# Patient Record
Sex: Male | Born: 1960 | Race: White | Hispanic: No | Marital: Married | Smoking: Former smoker
Health system: Southern US, Community
[De-identification: ages and names within clinical notes are randomized; demographics above are authoritative.]

## PROBLEM LIST (undated history)

## (undated) DIAGNOSIS — I10 Essential (primary) hypertension: Secondary | ICD-10-CM

## (undated) DIAGNOSIS — I639 Cerebral infarction, unspecified: Secondary | ICD-10-CM

## (undated) HISTORY — DX: Essential (primary) hypertension: I10

## (undated) HISTORY — DX: Cerebral infarction, unspecified: I63.9

## (undated) HISTORY — PX: OTHER SURGICAL HISTORY: SHX169

---

## 2007-04-17 ENCOUNTER — Ambulatory Visit (HOSPITAL_BASED_OUTPATIENT_CLINIC_OR_DEPARTMENT_OTHER): Admission: RE | Admit: 2007-04-17 | Discharge: 2007-04-17 | Payer: Self-pay | Admitting: Urology

## 2007-04-22 ENCOUNTER — Ambulatory Visit (HOSPITAL_COMMUNITY): Admission: RE | Admit: 2007-04-22 | Discharge: 2007-04-22 | Payer: Self-pay | Admitting: Urology

## 2007-05-26 ENCOUNTER — Ambulatory Visit (HOSPITAL_COMMUNITY): Admission: RE | Admit: 2007-05-26 | Discharge: 2007-05-26 | Payer: Self-pay | Admitting: Urology

## 2007-06-25 ENCOUNTER — Ambulatory Visit (HOSPITAL_COMMUNITY): Admission: RE | Admit: 2007-06-25 | Discharge: 2007-06-25 | Payer: Self-pay | Admitting: Urology

## 2008-03-12 ENCOUNTER — Ambulatory Visit (HOSPITAL_COMMUNITY): Admission: RE | Admit: 2008-03-12 | Discharge: 2008-03-12 | Payer: Self-pay | Admitting: Urology

## 2008-04-16 ENCOUNTER — Encounter (INDEPENDENT_AMBULATORY_CARE_PROVIDER_SITE_OTHER): Payer: Self-pay | Admitting: General Surgery

## 2008-04-16 ENCOUNTER — Ambulatory Visit (HOSPITAL_COMMUNITY): Admission: RE | Admit: 2008-04-16 | Discharge: 2008-04-16 | Payer: Self-pay | Admitting: General Surgery

## 2008-07-09 ENCOUNTER — Emergency Department (HOSPITAL_COMMUNITY): Admission: EM | Admit: 2008-07-09 | Discharge: 2008-07-09 | Payer: Self-pay | Admitting: *Deleted

## 2008-07-16 ENCOUNTER — Emergency Department (HOSPITAL_COMMUNITY): Admission: EM | Admit: 2008-07-16 | Discharge: 2008-07-16 | Payer: Self-pay | Admitting: Emergency Medicine

## 2010-10-17 NOTE — Op Note (Signed)
NAME:  Connor Phillips, Connor Phillips NO.:  [id number]   MEDICAL RECORD NO.:  [id number]          PATIENT TYPE:  AMB   LOCATION:  [facility]                         FACILITY:  [facility]   PHYSICIAN:  Lindaann Slough, M.D.  DATE OF BIRTH:  10-30-1960   DATE OF PROCEDURE:  04/17/2007  DATE OF DISCHARGE:                               OPERATIVE REPORT   PREOPERATIVE DIAGNOSES:  1. Right ureteral stone.  2. Left ureteropelvic junction stone with bilateral hydronephrosis.   POSTOPERATIVE DIAGNOSES:  1. Right ureteral stone.  2. Left ureteropelvic junction stone with bilateral hydronephrosis.   PROCEDURE:  Cystoscopy, bilateral retrograde pyelogram, ureteroscopy,  holmium laser right ureteral stone and insertion of bilateral double-J  catheters.   SURGEON:  Dr. Brunilda Payor.   ANESTHESIA:  General.   INDICATIONS:  The patient is 50 year old male who was seen in the office  yesterday afternoon with a history of sudden onset of right flank and  right lower quadrant pain 5 days ago.  He was seen at the urgent care by  Dr. Earlene Plater, and a KUB showed a large stone in the left renal pelvis.  The  patient was given an injection of Rocephin and treated with Cipro.  The  pain was associated with nausea but no vomiting.  He had been taking  ibuprofen at home, and he has not had any more pain since Saturday.   When I saw him in the office, I obtained a CT scan that showed severe  left hydronephrosis with a 16-mm stone at the left UPJ and a 6-mm stone  at the right mid ureter with moderate hydronephrosis.  The patient is  scheduled today for ureteroscopy, holmium laser of the right ureteral  stone and insertion of left double-J catheter.   Under general anesthesia, the patient was prepped and draped and placed  in the dorsal lithotomy position.  A #22 panendoscope was inserted in  the bladder.  The anterior urethra is normal.  There is moderate  prostatic hypertrophy.  The bladder mucosa is normal.  There is  no stone  or tumor in the bladder.  The ureteral orifices are in normal position  and shape.   Right Retrograde pyelogram.   A sensor tip guide wire was passed through an open-ended catheter, and  the guide wire and ureteral catheter were passed through the cystoscope  into the right ureteral orifice.  The sensor tip guidewire was then  passed into the ureter and advanced all of the way up into the renal  pelvis.  The open-ended catheter was then advanced over the guidewire  into the distal ureter.  The guidewire was then removed.  Contrast was  then injected through the open-ended catheter.  The distal ureter  appears normal.  There is a filling defect in the mid ureter, and there  is moderate dilation of the ureter and the collecting system.  The  sensor tip guidewire was then passed through the ureteral catheter, and  the ureteral catheter was removed.   The inner sheath of a ureteroscope access sheath was passed over the  guidewire, and the intramural  ureter was then dilated.  The ureteroscope  access sheath was removed.  A #6.5 French rigid ureteroscope was then  passed in the bladder and in the ureter.  The ureteroscope was advanced  up to the mid ureter, and the stone was visualized.  The stone was  impacted in the mid ureter.  A holmium laser fiber was then passed  through the ureteroscope, and the stone was fragmented in multiple stone  fragments.  The stone fragments were then extracted with the nitinol  stone basket.  The guidewire was then back loaded into the cystoscope,  and a #6-French 26 double-J catheter was passed over the guidewire.  The  proximal curl of the double-J catheter is in the renal pelvis; the  distal curl is in the bladder.  The guidewire was then removed.   Left Retrograde Pyelogram:   The sensor tip guidewire was then passed over an open-ended catheter,  and the guide wire was passed through the left ureteral orifice and  advanced up to the UPJ  stone, and the guidewire could not pass the  stone.  The open-ended catheter was then advanced all the way up to the  upper ureter, and the sensor tip guidewire was removed.  A glide wire  was passed through the open-ended catheter, and after several attempts,  I was able to pass the Glidewire beyond the stone into the renal pelvis.  Then, the open-ended catheter was passed beyond the stone with some  difficulty.  The guidewire was then removed.  Contrast was then injected through the open-ended catheter.  The renal  pelvis and collecting system are markedly dilated.  The sensor tip  guidewire was then passed through the open-ended catheter, and the open-  ended catheter was removed.   The guidewire was then back loaded into the cystoscope, and a 6-French  26 double-J catheter was passed over the guidewire with some difficulty  into the renal pelvis.  The guidewire was then removed.  The proximal  curl of the double-J catheter is in the renal pelvis.  The distal curl  is in the bladder.  The bladder was then emptied, and the cystoscope was  removed.   The patient tolerated the procedure well and left the OR in satisfactory  condition to post-anesthesia care unit.      Lindaann Slough, M.D.  Electronically Signed     MN/MEDQ  D:  04/17/2007  T:  04/18/2007  Job:  [id number]   cc:   Louanna Raw  Fax: [phone]

## 2010-10-17 NOTE — Op Note (Signed)
NAME:  Connor Phillips, Connor Phillips NO.:  [id number]   MEDICAL RECORD NO.:  [id number]          PATIENT TYPE:  AMB   LOCATION:  DAY                          FACILITY:  [facility]   PHYSICIAN:  Juanetta Gosling, MDDATE OF BIRTH:  14-Feb-1961   DATE OF PROCEDURE:  04/16/2008  DATE OF DISCHARGE:                               OPERATIVE REPORT   PREOPERATIVE DIAGNOSIS:  Postoperative neck mass.   POSTOPERATIVE DIAGNOSIS:  Sebaceous cyst, posterior neck.   PROCEDURE:  Posterior neck mass excisional biopsy.   SURGEON:  Juanetta Gosling, M.D.   ASSISTANT:  None.   ANESTHESIA:  General.   FINDINGS:  Sebaceous cyst.   SPECIMENS:  Cyst and contents to pathology.   ESTIMATED BLOOD LOSS:  Minimal.   COMPLICATIONS:  None.   DRAINS:  None.   DISPOSITION:  To PACU in stable condition.   INDICATIONS:  Mr. Fudala is a 50 year old male with a long-term history of  posterior neck mass slowly increasing in size over that time. When I  examined him, he had about 2 x 3-cm mobile mass that was nontender. I  counseled for excisional biopsy.   PROCEDURE:  After informed consent was obtained, the patient was taken  to the operating room. He was placed under general endotracheal  anesthesia without complication and rolled into the prone position and  appropriately padded. The area had previously been identified. An  approximately 3-cm incision was made overlying the mass which was then  extended down to the level of the mass. The mass was entered at one  position, and this was noted to be a very large sebaceous cyst with a  large amount of material involved with it. All the material was removed,  and the entire cyst in all of the cyst wall was also excised using  electrocautery. This was then copiously irrigated. I  closed the dermis with a 4-0 Vicryl and then closed the skin with a 2-0  nylon with external sutures that will be removed in 2 weeks. Sterile  dressing was placed on this. He  tolerated this well and was transferred  to the recovery room in stable condition.      Juanetta Gosling, MD  Electronically Signed     MCW/MEDQ  D:  04/16/2008  T:  04/16/2008  Job:  [id number]

## 2010-10-17 NOTE — Op Note (Signed)
NAME:  Connor Phillips, Connor Phillips NO.:  [id number]   MEDICAL RECORD NO.:  [id number]          PATIENT TYPE:  AMB   LOCATION:  DAY                          FACILITY:  [facility]   PHYSICIAN:  Valetta Fuller, M.D.  DATE OF BIRTH:  [birth year]   DATE OF PROCEDURE:  DATE OF DISCHARGE:                               OPERATIVE REPORT   PREOPERATIVE DIAGNOSIS:  1. Large impacted left proximal ureteral calculus.  2. Poorly functioning left kidney.   POSTOPERATIVE DIAGNOSIS:  1. Large impacted left proximal ureteral calculus.  2. Poorly functioning left kidney.   PROCEDURE PERFORMED:  Cystoscopy with double-J stent removal, left  retrograde pyelography, left rigid ureteroscopy, left flexible  ureteroscopy, holmium laser lithotripsy with basketing of fragments and  left double-J stent placement.   SURGEON:  Dr. Isabel Caprice.   ANESTHESIA:  General.   INDICATIONS:  Connor Phillips is a 50 year old male.  He had presented with  right flank pain and hematuria and was diagnosed with a right distal  ureteral calculus.  He was under the care of Dr. Su Grand at that time.  The patient was also noted to have severe left sided hydronephrosis for  what appeared to be a longstanding chronically obstructed left kidney  from a 12 x 9 mm stone at the left ureteropelvic junction.  The  patient's subsequently had successful ureteroscopy and treatment of the  right distal stone.  A left double-J stent was placed.  The patient has  been noted to have a substantially poorly functioning kidney on the left  side.  His Lasix renal scan showed this kidney only to be functioning at  approximately 15-20%.  The patient however did elect to try to have this  stone removed and the kidney unobstructed to try to preserve whatever  renal function was available.  The patient did undergo ureteroscopic  surgery approximately a month ago.  At that time we debulked as much of  the impacted stone as we could but then due to some  bleeding  visualization got poor and we had to abandon the procedure at that time.  The patient presents now for another attempt at dealing with this highly  impacted large stone.   TECHNIQUE AND FINDINGS:  The patient was brought to the operating room  where he had successful induction of general anesthesia.  He was placed  in lithotomy position, prepped and draped in the usual manner.  Cystoscopy initially was unremarkable.  The left double-J stent was seen  in good position.  It was grasped and partly removed out of the penis.  A guidewire was placed through the stent up to the left renal pelvis.  The cystoscope was removed at that point.  A long rigid 6.5 French  ureteroscope was then easily engaged in the ureter and driven up to the  stone.  We were able to get beyond the stone and into the renal pelvis.  The stone itself was markedly adherent to the wall of the ureter and  certainly significantly impacted within the mucosa.  There was quite a  bit of resultant ureteral edema.  With the rigid ureteroscope, we were  able to start to perform laser lithotripsy on the stone.  Unfortunately  due to the angles and the impacted nature of the stone, we were only  able to debulk a portion of it with the rigid scope. For that reason, we  removed the rigid ureteroscope and placed an access sheath.  A flexible  ureteroscope was then placed.  We continued to work on the stone with a  365 degree holmium laser fiber.  Eventually we were able to get the  stone out of the mucosa and wall of the bladder and at that point it was  freely floating in the proximal ureter right at the ureteropelvic  junction.  The stone, however then migrated into a lower pole calix.   With the flexible ureteroscope, we were again able to locate the stone.  We spent then an additional hour to an hour and 15 minutes performing  laser lithotripsy on the stone.  It was clearly markedly dense and very  hard.  We ended up  having to drill a series of holes in the stone and  then slowly chip away at this.  As we did fragments were basket  extracted.  The largest piece was approximately 10 x 8 mm which was  removed with the basket. At the end of the procedure,  there appeared to  be several small fragments, but we felt that the majority of the stone  had been markedly debulked and most importantly we had been able to get  the stone out of the proximal ureter that had been heavily impacted.   We ended up performing the retrograde pyelogram and confirmed that the  ureter was intact as was the collecting system.  This was done with  fluoroscopic guidance and interpretation.  Once the ureteroscope and  sheath were removed, we ended up placing an 8-French 24 cm stent over  the guidewire.  This was confirmed to be in good position.  The patient  appeared to tolerate the procedure well and no obvious complications  occurred.  He was brought to the recovery room in stable condition.  Lidocaine jelly was instilled per urethra and he was given at the end of  suppository.  The large stone fragment was given to the patient's wife  so he could examine this and we will send for stone analysis down the  road.           ______________________________  Valetta Fuller, M.D.  Electronically Signed     DSG/MEDQ  D:  06/25/2007  T:  06/25/2007  Job:  [id number]

## 2010-10-17 NOTE — Op Note (Signed)
NAME:  Connor Phillips, Connor Phillips NO.:  [id number]   MEDICAL RECORD NO.:  [id number]          PATIENT TYPE:  AMB   LOCATION:  DAY                          FACILITY:  [facility]   PHYSICIAN:  Valetta Fuller, M.D.  DATE OF BIRTH:  Jul 13, 1960   DATE OF PROCEDURE:  05/26/2007  DATE OF DISCHARGE:  05/26/2007                               OPERATIVE REPORT   PREOPERATIVE DIAGNOSIS:  Large impacted left proximal ureteral calculus.   POSTOPERATIVE DIAGNOSIS:  Large impacted left proximal ureteral  calculus.   PROCEDURE PERFORMED:  Cystoscopy, left retrograde pyelography, left  rigid and left flexible ureteroscopy with Holmium laser lithotripsy of  calculus, double-J stent placement.   SURGEON:  Valetta Fuller, M.D.   ANESTHESIA:  General.   INDICATIONS:  Connor Phillips is a 50 year old male.  He had developed the  sudden onset of right flank pain and a CT scan showed a right ureteral  stone.  Incidentally, he was noted to have an asymptomatic stone at his  left ureteropelvic junction/proximal ureter.  On that side, he had what  appeared to be very severe hydronephrosis with very little in the way of  what appeared to be functioning parenchyma.  The patient subsequently  did have a renal scan which showed only about 15% of the overall  function coming from the left kidney.  His creatinine, which had been  markedly elevated, did return down to 1.6.  He subsequently had a  treatment by Dr. Brunilda Payor which included successful right ureteroscopy with  stent placement and a double-J stent placed on the left side.  The  patient had an extensive discussion with Dr. Brunilda Payor and then myself about  treatment options.  We felt that the left kidney had very little  function and probably not a lot of salvageable function.  We did not  feel the kidney needed to be removed unless it was more clinically  detrimental to him.  We did feel, however, that it might be reasonable  to make an attempt to unobstruct  that kidney to try to lessen the  likelihood of him having problems down the road.  He wanted an attempt  to try to salvage that kidney and whatever remaining function that  possibly could be salvaged.  Again, I do not think he has enough  functioning of the left kidney to stay off dialysis if he lost the right  kidney, but we thought it would be worth an attempt with ureteroscopy to  try to render him stone free if at all possible.  He understood that  could be potentially very difficult due to the location, size of the  stone, as well as  its very long standing probably impacted nature.   TECHNIQUE AND FINDINGS:  The patient was brought to the operating room  where he had successful induction of general anesthesia.  He was placed  in lithotomy position, prepped and draped in the usual manner.  Cystoscopy revealed a relatively unremarkable prostate.  Bilateral  double-J stents were in good position.  The right double-J stent was  removed as per Dr.  Nesi's instructions since the right ureteral stone  had been dealt with successfully.  The left stent was partially removed  and a guidewire was placed through the stent.  On fluoroscopy, one could  see a dense stone in the proximal ureter maybe 1 cm beneath the  ureteropelvic junction.  Once the stent was removed, we did perform  retrograde pyelogram and one could see a very high grade obstruction  secondary to that stone.  There appeared to be marked hydronephrosis  with severe dilation of the renal pelvis and caliceal system.   Rigid ureteroscopy was then performed.  The distal ureter was easily  engaged.  The long ureteroscope was utilized and we were able to get up  to within about 1 cm of the stone, but because of considerable mucosa  edema and an acute angulation, we were really unable to visualize the  stone with the rigid ureteroscope.  For that reason, the rigid  ureteroscope was removed and an access sheath was placed.  We then   utilized a flexible ureteroscope and were able to get up to the stone.  The stone, itself, appeared to be markedly impacted within the ureter.  There was evidence that the mucosa was partially growing over portions  of the stone and, again, this appeared consistent with a long standing  impacted calculus in this location.  We used the Holmium laser fiber to  start to fracture the stone.  The stone was markedly difficult to  fracture and little pieces came off one at a time.  We spent  approximately 1 hour 15 minutes debulking as much of the stone as we  could.  At that point, because of ongoing oozing from irritation of the  mucosa, visualization became more difficult and we did not feel it was  safe to continue to proceed.  The patient clearly had some residual  stone and it was difficult to really determine precisely how much of the  stone we had actually fractured, but probably in the order of 1/3 to 1/2  of the stone.  We decided to stop the procedure at that time for safety  reasons and felt that if he wanted another attempt that we would bring  him back at another time.  Over the guidewire, we went ahead and placed  an 8-French 24 cm stent without much difficulty.  The patient appeared  to tolerate things well and there were no obvious complications or  difficulties.  He was brought to the recovery room in stable condition.           ______________________________  Valetta Fuller, M.D.  Electronically Signed     DSG/MEDQ  D:  05/28/2007  T:  05/28/2007  Job:  [id number]

## 2011-02-22 LAB — HEMOGLOBIN AND HEMATOCRIT, BLOOD: Hemoglobin: 15.2

## 2011-03-06 LAB — CBC
Hemoglobin: 16.4
MCHC: 33.9
Platelets: 250
RBC: 5.14

## 2011-03-06 LAB — DIFFERENTIAL
Basophils Relative: 2 — ABNORMAL HIGH
Eosinophils Relative: 3
Monocytes Relative: 10
Neutro Abs: 2.6
Neutrophils Relative %: 50

## 2011-03-13 LAB — POCT HEMOGLOBIN-HEMACUE: Hemoglobin: 11.7 — ABNORMAL LOW

## 2011-03-13 LAB — URINE CULTURE: Colony Count: NO GROWTH

## 2012-01-17 ENCOUNTER — Encounter: Payer: Self-pay | Admitting: Sports Medicine

## 2012-01-17 ENCOUNTER — Ambulatory Visit (INDEPENDENT_AMBULATORY_CARE_PROVIDER_SITE_OTHER): Payer: BC Managed Care – PPO | Admitting: Sports Medicine

## 2012-01-17 VITALS — BP 141/105 | HR 61 | Ht 67.0 in | Wt 165.0 lb

## 2012-01-17 DIAGNOSIS — M2170 Unequal limb length (acquired), unspecified site: Secondary | ICD-10-CM | POA: Insufficient documentation

## 2012-01-17 DIAGNOSIS — M25569 Pain in unspecified knee: Secondary | ICD-10-CM

## 2012-01-17 DIAGNOSIS — M25561 Pain in right knee: Secondary | ICD-10-CM | POA: Insufficient documentation

## 2012-01-17 NOTE — Assessment & Plan Note (Signed)
Given a felt correction to use in his running shoes

## 2012-01-17 NOTE — Assessment & Plan Note (Signed)
I think he is an excellent candidate for rehabilitation exercises  Work on his strength and then when he returns check his running gait as well

## 2012-01-17 NOTE — Progress Notes (Signed)
Subjective Connor Phillips is a pleasant 51 y.o male who presents to [facility] for persistent right knee pain.  Patient states he has more anterior knee pain which is worse with running and stairs.  He recently began getting back into running and has been having some more anterior knee pain.  He has tried to warm up on a stationary bike which does help.  He also does better on soft surfaces vs. Hard.  No weakness or numbness.  No noted injury.  No swelling.  Does state about 10 years ago when playing tennis he may have "tweaked" his knee - at that time had negative plain films and told he had mild inflammation and resolved on its own with rest.  Active Ambulatory Problems    Diagnosis Date Noted  . No Active Ambulatory Problems   Resolved Ambulatory Problems    Diagnosis Date Noted  . No Resolved Ambulatory Problems   No Additional Past Medical History   History reviewed. No pertinent family history.  History reviewed. No pertinent past surgical history.  History   Social History  . Marital Status: Married    Spouse Name: N/A    Number of Children: N/A  . Years of Education: N/A   Social History Main Topics  . Smoking status: Former Smoker    Quit date: 10/03/2010  . Smokeless tobacco: Never Used  . Alcohol Use: 1.2 oz/week    2 Cans of beer per week  . Drug Use: None  . Sexually Active: None   Other Topics Concern  . None   Social History Narrative  . None   ROS: as stated in HPI  Objective  Filed Vitals:   01/17/12 1004  BP: 141/105  Pulse: 61    GEN: NAD, well appearing Normal affect Right knee: mild crepitus vs left.  No swelling appreciated.  No TTP to bony prominences.  No joint line tenderness.  Negative provocative testing of ACL, PCL, LCL, MCL and meniscus.  Has atrophy of VMO on right compared to left. Foot exam - normal arch Hip exam - pain and weakness with abductor testing on right compared to left Does have limb length discrepancy.  Right measures 90cm, Left  measures 91.3cm. Neurovascularly intact distally.  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 In clinic today address all questions for patient 2. Gave specific home instructions to start for strengthening particularly VMO and hip abductors 3. Given limb length longer on L > R - gave heel lift in right shoe for added support to assist in biomechanics 4. Instructed to continue to run as tolerated 5. Advised to warm up prior to running - stationary bike vs. Walking 6. Stretch afterwards  Patient understands and agrees with plan

## 2012-02-28 ENCOUNTER — Ambulatory Visit (INDEPENDENT_AMBULATORY_CARE_PROVIDER_SITE_OTHER): Payer: BC Managed Care – PPO | Admitting: Sports Medicine

## 2012-02-28 VITALS — BP 138/80 | Ht 67.0 in | Wt 165.0 lb

## 2012-02-28 DIAGNOSIS — M25569 Pain in unspecified knee: Secondary | ICD-10-CM

## 2012-02-28 DIAGNOSIS — M25561 Pain in right knee: Secondary | ICD-10-CM

## 2012-02-28 NOTE — Progress Notes (Signed)
  Subjective:    Patient ID: Connor Phillips, male    DOB: [birth year], 51 y.o.   MRN: [id number]  HPI  1. Knee pain:  Here for f/u of R knee pain.  States that the pain he was experiencing has resolved, however he has new pain.  Pain now located along medial aspect of knee.  Pain is worse after running.  Does not bother him using elliptical.  Has not noticed any swelling.  He was doing around 1.5 miles per day and increased to 3-3.5 miles recently.  He is also having pain from shin splints on the R leg.   Review of Systems Denies fever, chills, joint swelling or pain.    Objective:   Physical Exam R Knee: Normal to inspection with no erythema or effusion or obvious bony abnormalities. Palpation with tenderness at pes anserine.   No warmth or joint line tenderness or patellar tenderness or condyle tenderness. ROM normal in flexion and extension and lower leg rotation. Ligaments with solid consistent endpoints including ACL, PCL, LCL, MCL. Negative Mcmurray's and provocative meniscal tests. Non painful patellar compression. Patellar and quadriceps tendons unremarkable. Hamstring and quadriceps strength is normal.  Mild pain with sartorius testing  TTP along distal medial tibia, non-localizing  Foot:  Loss of long arch of R foot.        Assessment & Plan:

## 2012-02-28 NOTE — Assessment & Plan Note (Signed)
Previous pain resolved, now more likely to be pain at the pes anserine with some pain testing sartorius Fitted for knee compression sleeve Given exercises to strengthen sartorius  Advised to stick at 1.5-2 miles a couple days per week and when comfortable for 6 weeks, can began to gradually increase mileage. Continue current insoles with heel lift to correct leg length discrepancy We will see back in 6-8 weeks, if not improving will plan for custom orthotics

## 2012-04-10 ENCOUNTER — Ambulatory Visit (INDEPENDENT_AMBULATORY_CARE_PROVIDER_SITE_OTHER): Payer: BC Managed Care – PPO | Admitting: Sports Medicine

## 2012-04-10 ENCOUNTER — Encounter: Payer: Self-pay | Admitting: Sports Medicine

## 2012-04-10 VITALS — HR 76 | Ht 67.0 in | Wt 165.0 lb

## 2012-04-10 DIAGNOSIS — S86899A Other injury of other muscle(s) and tendon(s) at lower leg level, unspecified leg, initial encounter: Secondary | ICD-10-CM | POA: Insufficient documentation

## 2012-04-10 DIAGNOSIS — M25569 Pain in unspecified knee: Secondary | ICD-10-CM

## 2012-04-10 DIAGNOSIS — M2170 Unequal limb length (acquired), unspecified site: Secondary | ICD-10-CM

## 2012-04-10 DIAGNOSIS — M25561 Pain in right knee: Secondary | ICD-10-CM

## 2012-04-10 DIAGNOSIS — IMO0002 Reserved for concepts with insufficient information to code with codable children: Secondary | ICD-10-CM

## 2012-04-10 NOTE — Assessment & Plan Note (Signed)
Shin splints from increased stress after prolonged period of not running exacerbated by leg length discrepancy. Believe patient would benefit from custom orthotics-patient scheduling visit for this purpose. Patient will continue to only slowly increase running and stop at any point where pain develops.

## 2012-04-10 NOTE — Assessment & Plan Note (Signed)
Strength has improved in sartorius and quadriceps with strenthening program. Knee pain has resolved both from patellofemoral and pes anserine insertion point. Wearing knee sleeve when running.

## 2012-04-10 NOTE — Assessment & Plan Note (Signed)
Will advance from insoles to custom orthotics.

## 2012-04-10 NOTE — Progress Notes (Signed)
Subjective:   HPI  1. Knee pain follow up-right medial knee pain resolved upon wearing knee sleeve and starting strengthening exercises for sartorius.   Patient started running a few days after the knee pain had resolved. He was using his insoles during this time. On the first day running he developed bilateral shin pain that lasted for 3 days and kept him from running. Patient transitioned to elliptical for 3 weeks. He returned to running at that time and pain had resolved but he completed lighter runs such as 1.5 miles 1-2x per week. He has an ultimate goal of running a 5k and wants help slowly getting to his goal. Patient has not run in over 35 years before this past year.   ROS--See HPI  Past Medical History-does not have a PCP, leg length discrepancy.  Reviewed problem list.  Medications- reviewed and updated Chief complaint-noted  Objective: Pulse 76  Ht 5\' 7"  (1.702 m)  Wt 165 lb (74.844 kg)  BMI 25.84 kg/m2 Gen: NAD, resting comfortably on table MSK R knee:  Normal to inspection with no erythema or effusion or obvious bony abnormalities.  No pain with palpation  at pes anserine. Non painful patellar compression.  No warmth or joint line tenderness or patellar tenderness or condyle tenderness.  ROM normal in flexion and extension and lower leg rotation.  Ligaments with solid consistent endpoints including ACL, PCL, LCL, MCL. Negative Mcmurray.  Hamstring and quadriceps strength is normal. No pain with sartorius testing. Quadricep and sartorius strength is increased since previous exam. VMO length approximately 2cm shorter than desired.  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 Mild pronation noted. Leg length discrepancy with left leg approximately 1 cm longer than right.    Assessment/Plan: See problem oriented charted  Also advised patient to establish with PCP given that he has none.

## 2012-05-08 ENCOUNTER — Encounter: Payer: BC Managed Care – PPO | Admitting: Sports Medicine

## 2012-06-10 ENCOUNTER — Encounter: Payer: BC Managed Care – PPO | Admitting: Sports Medicine

## 2012-12-02 ENCOUNTER — Encounter: Payer: Self-pay | Admitting: Internal Medicine

## 2013-01-23 ENCOUNTER — Ambulatory Visit (AMBULATORY_SURGERY_CENTER): Payer: BC Managed Care – PPO

## 2013-01-23 ENCOUNTER — Encounter: Payer: Self-pay | Admitting: Internal Medicine

## 2013-01-23 VITALS — Ht 67.0 in | Wt 163.0 lb

## 2013-01-23 DIAGNOSIS — Z1211 Encounter for screening for malignant neoplasm of colon: Secondary | ICD-10-CM

## 2013-01-23 MED ORDER — MOVIPREP 100 G PO SOLR: 1.0000 | Freq: Once | ORAL | Status: DC

## 2013-02-06 ENCOUNTER — Ambulatory Visit (AMBULATORY_SURGERY_CENTER): Payer: BC Managed Care – PPO | Admitting: Internal Medicine

## 2013-02-06 ENCOUNTER — Encounter: Payer: Self-pay | Admitting: Internal Medicine

## 2013-02-06 VITALS — BP 123/83 | HR 62 | Temp 98.1°F | Resp 19 | Ht 67.0 in | Wt 163.0 lb

## 2013-02-06 DIAGNOSIS — D126 Benign neoplasm of colon, unspecified: Secondary | ICD-10-CM

## 2013-02-06 DIAGNOSIS — Z1211 Encounter for screening for malignant neoplasm of colon: Secondary | ICD-10-CM

## 2013-02-06 MED ORDER — SODIUM CHLORIDE 0.9 % IV SOLN: 500.0000 mL | INTRAVENOUS | Status: DC

## 2013-02-06 NOTE — Patient Instructions (Signed)

## 2013-02-06 NOTE — Progress Notes (Signed)
No complaints noted in the recovery room. Maw   

## 2013-02-06 NOTE — Progress Notes (Signed)
Report to pacu rn, vss, bbs=clear, alert and talking

## 2013-02-06 NOTE — Op Note (Signed)
[facility] [address] [place], [address]   COLONOSCOPY PROCEDURE REPORT  PATIENT: Ariv, Penrod  MR#: [id number] BIRTHDATE: 1960/12/22 , 52  yrs. old GENDER: Male ENDOSCOPIST: Beverley Fiedler, MD REFERRED BY: Alysia Penna, MD PROCEDURE DATE:  02/06/2013 PROCEDURE: First Screening Colonoscopy - Avg.  risk and is 50 yrs.  old or older Yes.  Prior Negative Screening - Now for repeat screening. N/A  History of Adenoma - Now for follow-up colonoscopy & has been > or = to 3 yrs.  N/A  Polyps Removed Today? Yes. ASA CLASS:   Class I INDICATIONS:average risk screening and first colonoscopy. MEDICATIONS: MAC sedation, administered by CRNA and Propofol (Diprivan) 270 mg IV  DESCRIPTION OF PROCEDURE:   After the risks benefits and alternatives of the procedure were thoroughly explained, informed consent was obtained.  A digital rectal exam revealed no rectal mass.   The LB [id number]  endoscope was introduced through the anus and advanced to the terminal ileum which was intubated for a short distance. No adverse events experienced.   The quality of the prep was good, using MoviPrep  The instrument was then slowly withdrawn as the colon was fully examined.   COLON FINDINGS: The mucosa appeared normal in the terminal ileum. A sessile polyp measuring 5-6 mm in size with a mucous cap was found in the ascending colon.  A polypectomy was performed with a cold snare.  The resection was complete and the polyp tissue was completely retrieved.   Four sessile polyps ranging between 3-65mm in size were found in the descending colon, sigmoid colon, and rectosigmoid colon.  Polypectomy was performed using cold snare. All resections were complete and all polyp tissue was completely retrieved.   There was mild scattered diverticulosis noted in the sigmoid colon.  Retroflexed views revealed no abnormalities. The time to cecum=1 minutes 17 seconds.  Withdrawal time=13 minutes  34 seconds.  The scope was withdrawn and the procedure completed.  COMPLICATIONS: There were no complications.    ENDOSCOPIC IMPRESSION: 1.   Normal mucosa in the terminal ileum 2.   Sessile polyp measuring 5-6 mm in size was found in the ascending colon; polypectomy was performed with a cold snare 3.   Four sessile polyps ranging between 3-64mm in size were found in the descending colon, sigmoid colon, and rectosigmoid colon; Polypectomy was performed using cold snare 4.   There was mild diverticulosis noted in the sigmoid colon  RECOMMENDATIONS: 1.  Await pathology results 2.  High fiber diet 3.  Timing of repeat colonoscopy will be determined by pathology findings. 4.  You will receive a letter within 1-2 weeks with the results of your biopsy as well as final recommendations.  Please call my office if you have not received a letter after 3 weeks.   eSigned:  Beverley Fiedler, MD 02/06/2013 9:00 AM   cc: The Patient, Alysia Penna, MD   PATIENT NAME:  Renly, Roots MR#: [id number]

## 2013-02-06 NOTE — Progress Notes (Signed)
Called to room to assist during endoscopic procedure.  Patient ID and intended procedure confirmed with present staff. Received instructions for my participation in the procedure from the performing physician.  

## 2013-02-09 ENCOUNTER — Telehealth: Payer: Self-pay

## 2013-02-09 NOTE — Telephone Encounter (Signed)
Left message on answering machine. 

## 2013-02-11 ENCOUNTER — Encounter: Payer: Self-pay | Admitting: Internal Medicine

## 2018-01-09 ENCOUNTER — Encounter: Payer: Self-pay | Admitting: Internal Medicine

## 2019-03-30 ENCOUNTER — Other Ambulatory Visit: Payer: Self-pay | Admitting: *Deleted

## 2019-03-30 DIAGNOSIS — Z20822 Contact with and (suspected) exposure to covid-19: Secondary | ICD-10-CM

## 2019-03-31 LAB — NOVEL CORONAVIRUS, NAA: SARS-CoV-2, NAA: NOT DETECTED

## 2020-04-16 ENCOUNTER — Ambulatory Visit: Payer: Self-pay | Attending: Internal Medicine

## 2020-04-16 DIAGNOSIS — Z23 Encounter for immunization: Secondary | ICD-10-CM

## 2020-04-16 NOTE — Progress Notes (Signed)
   Covid-19 Vaccination Clinic  Name:  CRAVEN CREAN    MRN: [id number] DOB: Oct 13, 1960  04/16/2020  Mr. Beza was observed post Covid-19 immunization for 15 minutes without incident. He was provided with Vaccine Information Sheet and instruction to access the V-Safe system.   Mr. Poser was instructed to call 911 with any severe reactions post vaccine: Marland Kitchen Difficulty breathing  . Swelling of face and throat  . A fast heartbeat  . A bad rash all over body  . Dizziness and weakness

## 2020-09-16 ENCOUNTER — Observation Stay (HOSPITAL_COMMUNITY)
Admission: EM | Admit: 2020-09-16 | Discharge: 2020-09-17 | Disposition: A | Discharge status: Home / Self Care | Payer: 59 | Attending: Internal Medicine | Admitting: Internal Medicine

## 2020-09-16 ENCOUNTER — Emergency Department (HOSPITAL_COMMUNITY): Payer: 59

## 2020-09-16 ENCOUNTER — Other Ambulatory Visit: Payer: Self-pay

## 2020-09-16 ENCOUNTER — Encounter (HOSPITAL_COMMUNITY): Payer: Self-pay

## 2020-09-16 ENCOUNTER — Observation Stay (HOSPITAL_COMMUNITY): Payer: 59

## 2020-09-16 DIAGNOSIS — Z87891 Personal history of nicotine dependence: Secondary | ICD-10-CM | POA: Diagnosis not present

## 2020-09-16 DIAGNOSIS — Y900 Blood alcohol level of less than 20 mg/100 ml: Secondary | ICD-10-CM | POA: Insufficient documentation

## 2020-09-16 DIAGNOSIS — Z20822 Contact with and (suspected) exposure to covid-19: Secondary | ICD-10-CM | POA: Diagnosis not present

## 2020-09-16 DIAGNOSIS — R471 Dysarthria and anarthria: Secondary | ICD-10-CM | POA: Insufficient documentation

## 2020-09-16 DIAGNOSIS — R29810 Facial weakness: Secondary | ICD-10-CM | POA: Diagnosis present

## 2020-09-16 DIAGNOSIS — R4781 Slurred speech: Secondary | ICD-10-CM | POA: Insufficient documentation

## 2020-09-16 DIAGNOSIS — I10 Essential (primary) hypertension: Secondary | ICD-10-CM | POA: Insufficient documentation

## 2020-09-16 LAB — URINALYSIS, ROUTINE W REFLEX MICROSCOPIC
Bilirubin Urine: NEGATIVE
Fatty Casts, UA: 1
Glucose, UA: NEGATIVE mg/dL
Ketones, ur: NEGATIVE mg/dL
Nitrite: NEGATIVE
Protein, ur: NEGATIVE mg/dL
Specific Gravity, Urine: 1.019 (ref 1.005–1.030)
WBC, UA: >50 WBC/hpf — ABNORMAL HIGH (ref 0–5)
pH: 6 (ref 5.0–8.0)

## 2020-09-16 LAB — COMPREHENSIVE METABOLIC PANEL
ALT: 23 U/L (ref 0–44)
AST: 26 U/L (ref 15–41)
Albumin: 4.1 g/dL (ref 3.5–5.0)
Alkaline Phosphatase: 76 U/L (ref 38–126)
Anion gap: 10 (ref 5–15)
BUN: 17 mg/dL (ref 6–20)
CO2: 24 mmol/L (ref 22–32)
Calcium: 9 mg/dL (ref 8.9–10.3)
Chloride: 107 mmol/L (ref 98–111)
Creatinine, Ser: 0.97 mg/dL (ref 0.61–1.24)
GFR, Estimated: >60 mL/min (ref >60)
Glucose, Bld: 89 mg/dL (ref 70–99)
Potassium: 3.8 mmol/L (ref 3.5–5.1)
Sodium: 141 mmol/L (ref 135–145)
Total Bilirubin: 0.6 mg/dL (ref 0.3–1.2)
Total Protein: 7.8 g/dL (ref 6.5–8.1)

## 2020-09-16 LAB — RESP PANEL BY RT-PCR (FLU A&B, COVID) ARPGX2
Influenza A by PCR: NEGATIVE
Influenza B by PCR: NEGATIVE
SARS Coronavirus 2 by RT PCR: NEGATIVE

## 2020-09-16 LAB — DIFFERENTIAL
Abs Immature Granulocytes: 0.05 10*3/uL (ref 0.00–0.07)
Basophils Absolute: 0.1 10*3/uL (ref 0.0–0.1)
Basophils Relative: 1 %
Eosinophils Absolute: 0.2 10*3/uL (ref 0.0–0.5)
Eosinophils Relative: 3 %
Immature Granulocytes: 1 %
Lymphocytes Relative: 24 %
Lymphs Abs: 1.8 10*3/uL (ref 0.7–4.0)
Monocytes Absolute: 0.7 10*3/uL (ref 0.1–1.0)
Monocytes Relative: 10 %
Neutro Abs: 4.4 10*3/uL (ref 1.7–7.7)
Neutrophils Relative %: 61 %

## 2020-09-16 LAB — CBC
HCT: 49.5 % (ref 39.0–52.0)
Hemoglobin: 17 g/dL (ref 13.0–17.0)
MCH: 31.8 pg (ref 26.0–34.0)
MCHC: 34.3 g/dL (ref 30.0–36.0)
MCV: 92.7 fL (ref 80.0–100.0)
Platelets: 229 10*3/uL (ref 150–400)
RBC: 5.34 MIL/uL (ref 4.22–5.81)
RDW: 12.5 % (ref 11.5–15.5)
WBC: 7.2 10*3/uL (ref 4.0–10.5)
nRBC: 0 % (ref 0.0–0.2)

## 2020-09-16 LAB — RAPID URINE DRUG SCREEN, HOSP PERFORMED
Amphetamines: NOT DETECTED
Barbiturates: NOT DETECTED
Benzodiazepines: NOT DETECTED
Cocaine: NOT DETECTED
Opiates: NOT DETECTED
Tetrahydrocannabinol: NOT DETECTED

## 2020-09-16 LAB — I-STAT CHEM 8, ED
BUN: 19 mg/dL (ref 6–20)
Calcium, Ion: 1.18 mmol/L (ref 1.15–1.40)
Chloride: 107 mmol/L (ref 98–111)
Creatinine, Ser: 1 mg/dL (ref 0.61–1.24)
Glucose, Bld: 89 mg/dL (ref 70–99)
HCT: 49 % (ref 39.0–52.0)
Hemoglobin: 16.7 g/dL (ref 13.0–17.0)
Potassium: 3.8 mmol/L (ref 3.5–5.1)
Sodium: 143 mmol/L (ref 135–145)
TCO2: 24 mmol/L (ref 22–32)

## 2020-09-16 LAB — APTT: aPTT: 27 seconds (ref 24–36)

## 2020-09-16 LAB — PROTIME-INR
INR: 0.9 (ref 0.8–1.2)
Prothrombin Time: 12.3 seconds (ref 11.4–15.2)

## 2020-09-16 LAB — ETHANOL: Alcohol, Ethyl (B): <10 mg/dL (ref <10)

## 2020-09-16 MED ORDER — SODIUM CHLORIDE 0.9 % IV SOLN: INTRAVENOUS | Status: DC

## 2020-09-16 MED ORDER — ACETAMINOPHEN 650 MG RE SUPP: 650.0000 mg | RECTAL | Status: DC | PRN

## 2020-09-16 MED ORDER — ACETAMINOPHEN 325 MG PO TABS
650.0000 mg | ORAL_TABLET | ORAL | Status: DC | PRN
  Administered 2020-09-17: 650 mg via ORAL
  Filled 2020-09-16: qty 2

## 2020-09-16 MED ORDER — ASPIRIN 81 MG PO CHEW
81.0000 mg | CHEWABLE_TABLET | Freq: Every day | ORAL | Status: DC
  Administered 2020-09-17: 81 mg via ORAL
  Filled 2020-09-16: qty 1

## 2020-09-16 MED ORDER — ACETAMINOPHEN 160 MG/5ML PO SOLN: 650.0000 mg | ORAL | Status: DC | PRN

## 2020-09-16 MED ORDER — ASPIRIN EC 325 MG PO TBEC
325.0000 mg | DELAYED_RELEASE_TABLET | Freq: Once | ORAL | Status: AC
  Administered 2020-09-16: 325 mg via ORAL
  Filled 2020-09-16: qty 1

## 2020-09-16 MED ORDER — ENOXAPARIN SODIUM 40 MG/0.4ML ~~LOC~~ SOLN
40.0000 mg | SUBCUTANEOUS | Status: DC
  Administered 2020-09-16: 40 mg via SUBCUTANEOUS
  Filled 2020-09-16 (×2): qty 0.4

## 2020-09-16 MED ORDER — AMLODIPINE BESYLATE 5 MG PO TABS
5.0000 mg | ORAL_TABLET | Freq: Every day | ORAL | Status: DC
  Administered 2020-09-16 – 2020-09-17 (×2): 5 mg via ORAL
  Filled 2020-09-16 (×2): qty 1

## 2020-09-16 MED ORDER — SENNOSIDES-DOCUSATE SODIUM 8.6-50 MG PO TABS: 1.0000 | ORAL_TABLET | Freq: Every evening | ORAL | Status: DC | PRN

## 2020-09-16 MED ORDER — HYDROCHLOROTHIAZIDE 12.5 MG PO CAPS
12.5000 mg | ORAL_CAPSULE | Freq: Every day | ORAL | Status: DC
  Administered 2020-09-16 – 2020-09-17 (×2): 12.5 mg via ORAL
  Filled 2020-09-16 (×2): qty 1

## 2020-09-16 MED ORDER — IOHEXOL 350 MG/ML SOLN
100.0000 mL | Freq: Once | INTRAVENOUS | Status: AC | PRN
  Administered 2020-09-16: 100 mL via INTRAVENOUS

## 2020-09-16 MED ORDER — STROKE: EARLY STAGES OF RECOVERY BOOK
Freq: Once | Status: AC
  Filled 2020-09-16: qty 1

## 2020-09-16 NOTE — H&P (Signed)
History and Physical    Connor Phillips EYC:[id number] DOB: 1961/05/12 DOA: 09/16/2020  PCP: Velna Hatchet, MD (Confirm with patient/family/NH records and if not entered, this has to be entered at [facility] point of entry) Patient coming from: home  I have personally briefly reviewed patient's old medical records in [place]  Chief Complaint: facial droop, slurred speech  HPI: Connor Phillips is a 60 y.o. male with medical history significant of HTN not on medication reports that 09/15/20 he noticed a mild left facial droop vs swelling of the right face. He also noted speech changes. This AM his wife also noted facial droop and slurred speech. A nurse in the family directed him to the [facility] for evaluation. He has not prior h/o CVA. He denies any focal weakness, loss of sensation, balance or gait problems.   ED Course: T 97.8  163/121  HR 85  R 17. No EDP exam report available. Cmet nl, CBCD nl, Tox screen negative, Covid Negative. CTA head and Neck w/o LVO, brain with ischmeic microangiopathy. TRH called to admit to complete stroke work-up on the advice of Neuro.  Review of Systems: As per HPI otherwise 10 point review of systems negative.    Past Medical History:  Diagnosis Date  . Hypertension    controlled by diet and exercise    Past Surgical History:  Procedure Laterality Date  . kidney stone removal      Soc Hx - married. He has 1 son, 1 daughter. Works as Catering manager.   reports that he quit smoking about 9 years ago. He has never used smokeless tobacco. He reports current alcohol use of about 1.0 - 2.0 standard drink of alcohol per week. He reports that he does not use drugs.  No Known Allergies  Family History  Problem Relation Age of Onset  . Colon cancer Neg Hx   . Esophageal cancer Neg Hx   . Rectal cancer Neg Hx   . Stomach cancer Neg Hx      Prior to Admission medications   Medication Sig Start Date End Date Taking? Authorizing Provider   Brimonidine Tartrate (LUMIFY) 0.025 % SOLN Place 1 drop into both eyes daily as needed (redness).   Yes [provider]  ibuprofen (ADVIL,MOTRIN) 200 MG tablet Take 400 mg by mouth every 6 (six) hours as needed for pain.   Yes [provider]    Physical Exam: Vitals:   09/16/20 2012 09/16/20 2030 09/16/20 2045 09/16/20 2115  BP: (!) 188/129 (!) 172/114 (!) 180/126 (!) 163/121  Pulse: 88 83 80 85  Resp: 19  17 17   Temp:      TempSrc:      SpO2: 98% 97% 96% 95%  Weight:      Height:         Vitals:   09/16/20 2012 09/16/20 2030 09/16/20 2045 09/16/20 2115  BP: (!) 188/129 (!) 172/114 (!) 180/126 (!) 163/121  Pulse: 88 83 80 85  Resp: 19  17 17   Temp:      TempSrc:      SpO2: 98% 97% 96% 95%  Weight:      Height:       General: WNWD, mildly overweight man in no distress Eyes: PERRL, lids and conjunctivae normal ENMT: Mucous membranes are moist. Posterior pharynx clear of any exudate or lesions.Normal dentition.  Neck: normal, supple, no masses, no thyromegaly Respiratory: clear to auscultation bilaterally, no wheezing, no crackles. Normal respiratory effort. No accessory  muscle use.  Cardiovascular: Regular rate and rhythm, no murmurs / rubs / gallops. No extremity edema. 2+ pedal pulses. No carotid bruits.  Abdomen: no tenderness, no masses palpated. No hepatosplenomegaly. Bowel sounds positive.  Musculoskeletal: no clubbing / cyanosis. No joint deformity upper and lower extremities. Good ROM, no contractures. Normal muscle tone.  Skin: no rashes, lesions, ulcers. No induration Neurologic: CN - flattening of left naso-labial fold, facial muscles otherwise move symmetrically, PERRLA, EOMI, able to blow out both cheeks, able to whistle, nl shoulder shrug, no deviation of tongue, no fasciculations. MS 5/5 thru-out. DTRs nl at biceps, radial, patellar and achilles tendons. Sensation intact face UE, LE. Nl balance, nl gait.  Psychiatric: Normal judgment and  insight. Alert and oriented x 3. Normal mood.     Labs on Admission: I have personally reviewed following labs and imaging studies  CBC: Recent Labs  Lab 09/16/20 1939 09/16/20 1947  WBC  --  7.2  NEUTROABS  --  4.4  HGB 16.7 17.0  HCT 49.0 49.5  MCV  --  92.7  PLT  --  938   Basic Metabolic Panel: Recent Labs  Lab 09/16/20 1939 09/16/20 1947  NA 143 141  K 3.8 3.8  CL 107 107  CO2  --  24  GLUCOSE 89 89  BUN 19 17  CREATININE 1.00 0.97  CALCIUM  --  9.0   GFR: Estimated Creatinine Clearance: 82.2 mL/min (by C-G formula based on SCr of 0.97 mg/dL). Liver Function Tests: Recent Labs  Lab 09/16/20 1947  AST 26  ALT 23  ALKPHOS 76  BILITOT 0.6  PROT 7.8  ALBUMIN 4.1   No results for input(s): LIPASE, AMYLASE in the last 168 hours. No results for input(s): AMMONIA in the last 168 hours. Coagulation Profile: Recent Labs  Lab 09/16/20 1947  INR 0.9   Cardiac Enzymes: No results for input(s): CKTOTAL, CKMB, CKMBINDEX, TROPONINI in the last 168 hours. BNP (last 3 results) No results for input(s): PROBNP in the last 8760 hours. HbA1C: No results for input(s): HGBA1C in the last 72 hours. CBG: No results for input(s): GLUCAP in the last 168 hours. Lipid Profile: No results for input(s): CHOL, HDL, LDLCALC, TRIG, CHOLHDL, LDLDIRECT in the last 72 hours. Thyroid Function Tests: No results for input(s): TSH, T4TOTAL, FREET4, T3FREE, THYROIDAB in the last 72 hours. Anemia Panel: No results for input(s): VITAMINB12, FOLATE, FERRITIN, TIBC, IRON, RETICCTPCT in the last 72 hours. Urine analysis:    Component Value Date/Time   COLORURINE STRAW (A) 09/16/2020 2045   APPEARANCEUR HAZY (A) 09/16/2020 2045   LABSPEC 1.019 09/16/2020 2045   PHURINE 6.0 09/16/2020 2045   GLUCOSEU NEGATIVE 09/16/2020 2045   HGBUR MODERATE (A) 09/16/2020 2045   BILIRUBINUR NEGATIVE 09/16/2020 2045   KETONESUR NEGATIVE 09/16/2020 2045   PROTEINUR NEGATIVE 09/16/2020 2045   NITRITE  NEGATIVE 09/16/2020 2045   LEUKOCYTESUR LARGE (A) 09/16/2020 2045    Radiological Exams on Admission: CT Angio Head W/Cm &/Or Wo Cm  Result Date: 09/16/2020 CLINICAL DATA:  Right facial droop and slurred speech EXAM: CT ANGIOGRAPHY HEAD AND NECK TECHNIQUE: Multidetector CT imaging of the head and neck was performed using the standard protocol during bolus administration of intravenous contrast. Multiplanar CT image reconstructions and MIPs were obtained to evaluate the vascular anatomy. Carotid stenosis measurements (when applicable) are obtained utilizing NASCET criteria, using the distal internal carotid diameter as the denominator. CONTRAST:  179mL OMNIPAQUE IOHEXOL 350 MG/ML SOLN COMPARISON:  None. FINDINGS: CT HEAD FINDINGS  Brain: There is no mass, hemorrhage or extra-axial collection. The size and configuration of the ventricles and extra-axial CSF spaces are normal. There is hypoattenuation of the periventricular white matter, most commonly indicating chronic ischemic microangiopathy. Skull: The visualized skull base, calvarium and extracranial soft tissues are normal. Sinuses/Orbits: No fluid levels or advanced mucosal thickening of the visualized paranasal sinuses. No mastoid or middle ear effusion. The orbits are normal. CTA NECK FINDINGS SKELETON: There is no bony spinal canal stenosis. No lytic or blastic lesion. OTHER NECK: Normal pharynx, larynx and major salivary glands. No cervical lymphadenopathy. Unremarkable thyroid gland. UPPER CHEST: No pneumothorax or pleural effusion. No nodules or masses. AORTIC ARCH: There is calcific atherosclerosis of the aortic arch. There is no aneurysm, dissection or hemodynamically significant stenosis of the visualized portion of the aorta. Conventional 3 vessel aortic branching pattern. The visualized proximal subclavian arteries are widely patent. RIGHT CAROTID SYSTEM: No dissection, occlusion or aneurysm. Mild atherosclerotic calcification at the carotid  bifurcation without hemodynamically significant stenosis. LEFT CAROTID SYSTEM: No dissection, occlusion or aneurysm. Mild atherosclerotic calcification at the carotid bifurcation without hemodynamically significant stenosis. VERTEBRAL ARTERIES: Left dominant configuration. Both origins are clearly patent. There is no dissection, occlusion or flow-limiting stenosis to the skull base (V1-V3 segments). CTA HEAD FINDINGS POSTERIOR CIRCULATION: --Vertebral arteries: Normal V4 segments. --Inferior cerebellar arteries: Normal. --Basilar artery: Normal. --Superior cerebellar arteries: Normal. --Posterior cerebral arteries (PCA): Normal. ANTERIOR CIRCULATION: --Intracranial internal carotid arteries: Normal. --Anterior cerebral arteries (ACA): Normal. Both A1 segments are present. Patent anterior communicating artery (a-comm). --Middle cerebral arteries (MCA): Normal. VENOUS SINUSES: As permitted by contrast timing, patent. ANATOMIC VARIANTS: None Review of the MIP images confirms the above findings. IMPRESSION: 1. No emergent large vessel occlusion or hemodynamically significant stenosis of the head or neck. 2. Chronic ischemic microangiopathy. Aortic Atherosclerosis (ICD10-I70.0). Electronically Signed   By: Ulyses Jarred M.D.   On: 09/16/2020 20:25   CT Angio Neck W and/or Wo Contrast  Result Date: 09/16/2020 CLINICAL DATA:  Right facial droop and slurred speech EXAM: CT ANGIOGRAPHY HEAD AND NECK TECHNIQUE: Multidetector CT imaging of the head and neck was performed using the standard protocol during bolus administration of intravenous contrast. Multiplanar CT image reconstructions and MIPs were obtained to evaluate the vascular anatomy. Carotid stenosis measurements (when applicable) are obtained utilizing NASCET criteria, using the distal internal carotid diameter as the denominator. CONTRAST:  150mL OMNIPAQUE IOHEXOL 350 MG/ML SOLN COMPARISON:  None. FINDINGS: CT HEAD FINDINGS Brain: There is no mass, hemorrhage or  extra-axial collection. The size and configuration of the ventricles and extra-axial CSF spaces are normal. There is hypoattenuation of the periventricular white matter, most commonly indicating chronic ischemic microangiopathy. Skull: The visualized skull base, calvarium and extracranial soft tissues are normal. Sinuses/Orbits: No fluid levels or advanced mucosal thickening of the visualized paranasal sinuses. No mastoid or middle ear effusion. The orbits are normal. CTA NECK FINDINGS SKELETON: There is no bony spinal canal stenosis. No lytic or blastic lesion. OTHER NECK: Normal pharynx, larynx and major salivary glands. No cervical lymphadenopathy. Unremarkable thyroid gland. UPPER CHEST: No pneumothorax or pleural effusion. No nodules or masses. AORTIC ARCH: There is calcific atherosclerosis of the aortic arch. There is no aneurysm, dissection or hemodynamically significant stenosis of the visualized portion of the aorta. Conventional 3 vessel aortic branching pattern. The visualized proximal subclavian arteries are widely patent. RIGHT CAROTID SYSTEM: No dissection, occlusion or aneurysm. Mild atherosclerotic calcification at the carotid bifurcation without hemodynamically significant stenosis. LEFT CAROTID SYSTEM: No dissection,  occlusion or aneurysm. Mild atherosclerotic calcification at the carotid bifurcation without hemodynamically significant stenosis. VERTEBRAL ARTERIES: Left dominant configuration. Both origins are clearly patent. There is no dissection, occlusion or flow-limiting stenosis to the skull base (V1-V3 segments). CTA HEAD FINDINGS POSTERIOR CIRCULATION: --Vertebral arteries: Normal V4 segments. --Inferior cerebellar arteries: Normal. --Basilar artery: Normal. --Superior cerebellar arteries: Normal. --Posterior cerebral arteries (PCA): Normal. ANTERIOR CIRCULATION: --Intracranial internal carotid arteries: Normal. --Anterior cerebral arteries (ACA): Normal. Both A1 segments are present.  Patent anterior communicating artery (a-comm). --Middle cerebral arteries (MCA): Normal. VENOUS SINUSES: As permitted by contrast timing, patent. ANATOMIC VARIANTS: None Review of the MIP images confirms the above findings. IMPRESSION: 1. No emergent large vessel occlusion or hemodynamically significant stenosis of the head or neck. 2. Chronic ischemic microangiopathy. Aortic Atherosclerosis (ICD10-I70.0). Electronically Signed   By: Ulyses Jarred M.D.   On: 09/16/2020 20:25    EKG: Independently reviewed. Sinus Rhythm, possilbe old anterior injury  Assessment/Plan Active Problems:   Facial droop   Dysarthria   HTN (hypertension)    1. Facial droop/dysarthria - TIA vs Bell's Palsey. Evaluation todate is negative. MRI pending, 2D EHCO pending. Plan Complete eval  ASA 81 mg daily  2. HTN- untreated HTN with microangiopathy on CTA head Plan CCB-Amlodipine + HCTZ  F/u with PCP - needs to establish for care  Regular life-style interventions   DVT prophylaxis: lovenox  Code Status: full code  Family Communication: spoke with Ferol Luz, spouse. Relayed test results, Dx plan and Tx plan Disposition Plan: home 24-48 hrs  Consults called: None - EDP spoke with neuro-hospitalist  Admission status: obs-tele   Adella Hare MD [facility] Pager [phone]  If 7PM-7AM, please contact night-coverage [email or web address] Password [facility]  09/16/2020, 10:08 PM

## 2020-09-16 NOTE — ED Triage Notes (Signed)
Pt presents w rt sided facial droop since 0600am this morning, and c/o minor slurred speech but states speech has improved over past 3 hours. Denies hx of stroke, cardiac or neuro issues

## 2020-09-16 NOTE — ED Notes (Signed)
Provider at bedside

## 2020-09-16 NOTE — ED Provider Notes (Signed)
[facility] DEPT Provider Note   CSN: [id number] Arrival date & time: 09/16/20  1832     History Chief Complaint  Patient presents with  . Facial Droop    Connor Phillips is a 60 y.o. male.  He is here with a complaint of some left-sided facial droop and slurred speech that started yesterday morning when he woke up.  He feels his speech is somewhat improved.  No other numbness or weakness.  No vision problems or balance problems.  The history is provided by the patient.  Cerebrovascular Accident This is a new problem. The current episode started yesterday. The problem occurs constantly. The problem has been gradually improving. Associated symptoms include headaches. Pertinent negatives include no chest pain, no abdominal pain and no shortness of breath. Nothing aggravates the symptoms. Nothing relieves the symptoms. He has tried nothing for the symptoms. The treatment provided no relief.       Past Medical History:  Diagnosis Date  . Hypertension    controlled by diet and exercise    Patient Active Problem List   Diagnosis Date Noted  . Shin splints 04/10/2012  . Knee pain, right 01/17/2012  . Leg length inequality 01/17/2012    Past Surgical History:  Procedure Laterality Date  . kidney stone removal         Family History  Problem Relation Age of Onset  . Colon cancer Neg Hx   . Esophageal cancer Neg Hx   . Rectal cancer Neg Hx   . Stomach cancer Neg Hx     Social History   Tobacco Use  . Smoking status: Former Smoker    Quit date: 10/03/2010    Years since quitting: 9.9  . Smokeless tobacco: Never Used  Vaping Use  . Vaping Use: Every day  Substance Use Topics  . Alcohol use: Yes    Alcohol/week: 1.0 - 2.0 standard drink    Types: 1 - 2 Cans of beer per week  . Drug use: No    Home Medications Prior to Admission medications   Medication Sig Start Date End Date Taking? Authorizing Provider  ibuprofen (ADVIL,MOTRIN) 200  MG tablet Take 400 mg by mouth every 6 (six) hours as needed for pain.    [provider]    Allergies    Patient has no known allergies.  Review of Systems   Review of Systems  Constitutional: Negative for fever.  HENT: Negative for sore throat.   Eyes: Negative for visual disturbance.  Respiratory: Negative for shortness of breath.   Cardiovascular: Negative for chest pain.  Gastrointestinal: Negative for abdominal pain.  Genitourinary: Negative for dysuria.  Musculoskeletal: Negative for neck pain.  Skin: Negative for rash.  Neurological: Positive for facial asymmetry, speech difficulty and headaches.    Physical Exam Updated Vital Signs BP (!) 198/130 (BP Location: Left Arm)   Pulse (!) 108   Temp 97.8 F (36.6 C) (Oral)   Resp 18   Ht 5\' 6"  (1.676 m)   Wt 79.4 kg   SpO2 98%   BMI 28.25 kg/m   Physical Exam Vitals and nursing note reviewed.  Constitutional:      Appearance: Normal appearance. He is well-developed.  HENT:     Head: Normocephalic and atraumatic.  Eyes:     Conjunctiva/sclera: Conjunctivae normal.  Cardiovascular:     Rate and Rhythm: Normal rate and regular rhythm.     Heart sounds: No murmur heard.   Pulmonary:  Effort: Pulmonary effort is normal. No respiratory distress.     Breath sounds: Normal breath sounds.  Abdominal:     Palpations: Abdomen is soft.     Tenderness: There is no abdominal tenderness.  Musculoskeletal:        General: No deformity or signs of injury. Normal range of motion.     Cervical back: Neck supple.  Skin:    General: Skin is warm and dry.     Capillary Refill: Capillary refill takes less than 2 seconds.  Neurological:     Mental Status: He is alert and oriented to person, place, and time.     Cranial Nerves: Cranial nerve deficit present.     Sensory: No sensory deficit.     Motor: No weakness.     Coordination: Coordination normal.     Gait: Gait normal.     Comments: He has some left-sided  facial asymmetry.  Appears to spare the forehead.  His speech sounds normal to me although he said it was slurred yesterday     ED Results / Procedures / Treatments   Labs (all labs ordered are listed, but only abnormal results are displayed) Labs Reviewed  URINALYSIS, ROUTINE W REFLEX MICROSCOPIC - Abnormal; Notable for the following components:      Result Value   Color, Urine STRAW (*)    APPearance HAZY (*)    Hgb urine dipstick MODERATE (*)    Leukocytes,Ua LARGE (*)    WBC, UA >50 (*)    Bacteria, UA RARE (*)    All other components within normal limits  LIPID PANEL - Abnormal; Notable for the following components:   Cholesterol 251 (*)    Triglycerides 160 (*)    LDL Cholesterol 177 (*)    All other components within normal limits  RESP PANEL BY RT-PCR (FLU A&B, COVID) ARPGX2  ETHANOL  PROTIME-INR  APTT  CBC  DIFFERENTIAL  COMPREHENSIVE METABOLIC PANEL  RAPID URINE DRUG SCREEN, HOSP PERFORMED  HIV ANTIBODY (ROUTINE TESTING W REFLEX)  HEMOGLOBIN A1C  I-STAT CHEM 8, ED    EKG EKG Interpretation  Date/Time:  Friday September 16 2020 19:24:46 EDT Ventricular Rate:  80 PR Interval:  154 QRS Duration: 78 QT Interval:  359 QTC Calculation: 415 R Axis:   1 Text Interpretation: Sinus rhythm Probable anterior infarct, old newform prior 11/08 Confirmed by Aletta Edouard [phone]) on 09/16/2020 7:27:44 PM   Radiology CT Angio Head W/Cm &/Or Wo Cm  Result Date: 09/16/2020 CLINICAL DATA:  Right facial droop and slurred speech EXAM: CT ANGIOGRAPHY HEAD AND NECK TECHNIQUE: Multidetector CT imaging of the head and neck was performed using the standard protocol during bolus administration of intravenous contrast. Multiplanar CT image reconstructions and MIPs were obtained to evaluate the vascular anatomy. Carotid stenosis measurements (when applicable) are obtained utilizing NASCET criteria, using the distal internal carotid diameter as the denominator. CONTRAST:  1109mL OMNIPAQUE  IOHEXOL 350 MG/ML SOLN COMPARISON:  None. FINDINGS: CT HEAD FINDINGS Brain: There is no mass, hemorrhage or extra-axial collection. The size and configuration of the ventricles and extra-axial CSF spaces are normal. There is hypoattenuation of the periventricular white matter, most commonly indicating chronic ischemic microangiopathy. Skull: The visualized skull base, calvarium and extracranial soft tissues are normal. Sinuses/Orbits: No fluid levels or advanced mucosal thickening of the visualized paranasal sinuses. No mastoid or middle ear effusion. The orbits are normal. CTA NECK FINDINGS SKELETON: There is no bony spinal canal stenosis. No lytic or blastic lesion. OTHER NECK:  Normal pharynx, larynx and major salivary glands. No cervical lymphadenopathy. Unremarkable thyroid gland. UPPER CHEST: No pneumothorax or pleural effusion. No nodules or masses. AORTIC ARCH: There is calcific atherosclerosis of the aortic arch. There is no aneurysm, dissection or hemodynamically significant stenosis of the visualized portion of the aorta. Conventional 3 vessel aortic branching pattern. The visualized proximal subclavian arteries are widely patent. RIGHT CAROTID SYSTEM: No dissection, occlusion or aneurysm. Mild atherosclerotic calcification at the carotid bifurcation without hemodynamically significant stenosis. LEFT CAROTID SYSTEM: No dissection, occlusion or aneurysm. Mild atherosclerotic calcification at the carotid bifurcation without hemodynamically significant stenosis. VERTEBRAL ARTERIES: Left dominant configuration. Both origins are clearly patent. There is no dissection, occlusion or flow-limiting stenosis to the skull base (V1-V3 segments). CTA HEAD FINDINGS POSTERIOR CIRCULATION: --Vertebral arteries: Normal V4 segments. --Inferior cerebellar arteries: Normal. --Basilar artery: Normal. --Superior cerebellar arteries: Normal. --Posterior cerebral arteries (PCA): Normal. ANTERIOR CIRCULATION: --Intracranial  internal carotid arteries: Normal. --Anterior cerebral arteries (ACA): Normal. Both A1 segments are present. Patent anterior communicating artery (a-comm). --Middle cerebral arteries (MCA): Normal. VENOUS SINUSES: As permitted by contrast timing, patent. ANATOMIC VARIANTS: None Review of the MIP images confirms the above findings. IMPRESSION: 1. No emergent large vessel occlusion or hemodynamically significant stenosis of the head or neck. 2. Chronic ischemic microangiopathy. Aortic Atherosclerosis (ICD10-I70.0). Electronically Signed   By: Ulyses Jarred M.D.   On: 09/16/2020 20:25   CT Angio Neck W and/or Wo Contrast  Result Date: 09/16/2020 CLINICAL DATA:  Right facial droop and slurred speech EXAM: CT ANGIOGRAPHY HEAD AND NECK TECHNIQUE: Multidetector CT imaging of the head and neck was performed using the standard protocol during bolus administration of intravenous contrast. Multiplanar CT image reconstructions and MIPs were obtained to evaluate the vascular anatomy. Carotid stenosis measurements (when applicable) are obtained utilizing NASCET criteria, using the distal internal carotid diameter as the denominator. CONTRAST:  154mL OMNIPAQUE IOHEXOL 350 MG/ML SOLN COMPARISON:  None. FINDINGS: CT HEAD FINDINGS Brain: There is no mass, hemorrhage or extra-axial collection. The size and configuration of the ventricles and extra-axial CSF spaces are normal. There is hypoattenuation of the periventricular white matter, most commonly indicating chronic ischemic microangiopathy. Skull: The visualized skull base, calvarium and extracranial soft tissues are normal. Sinuses/Orbits: No fluid levels or advanced mucosal thickening of the visualized paranasal sinuses. No mastoid or middle ear effusion. The orbits are normal. CTA NECK FINDINGS SKELETON: There is no bony spinal canal stenosis. No lytic or blastic lesion. OTHER NECK: Normal pharynx, larynx and major salivary glands. No cervical lymphadenopathy. Unremarkable  thyroid gland. UPPER CHEST: No pneumothorax or pleural effusion. No nodules or masses. AORTIC ARCH: There is calcific atherosclerosis of the aortic arch. There is no aneurysm, dissection or hemodynamically significant stenosis of the visualized portion of the aorta. Conventional 3 vessel aortic branching pattern. The visualized proximal subclavian arteries are widely patent. RIGHT CAROTID SYSTEM: No dissection, occlusion or aneurysm. Mild atherosclerotic calcification at the carotid bifurcation without hemodynamically significant stenosis. LEFT CAROTID SYSTEM: No dissection, occlusion or aneurysm. Mild atherosclerotic calcification at the carotid bifurcation without hemodynamically significant stenosis. VERTEBRAL ARTERIES: Left dominant configuration. Both origins are clearly patent. There is no dissection, occlusion or flow-limiting stenosis to the skull base (V1-V3 segments). CTA HEAD FINDINGS POSTERIOR CIRCULATION: --Vertebral arteries: Normal V4 segments. --Inferior cerebellar arteries: Normal. --Basilar artery: Normal. --Superior cerebellar arteries: Normal. --Posterior cerebral arteries (PCA): Normal. ANTERIOR CIRCULATION: --Intracranial internal carotid arteries: Normal. --Anterior cerebral arteries (ACA): Normal. Both A1 segments are present. Patent anterior communicating artery (a-comm). --Middle cerebral arteries (  MCA): Normal. VENOUS SINUSES: As permitted by contrast timing, patent. ANATOMIC VARIANTS: None Review of the MIP images confirms the above findings. IMPRESSION: 1. No emergent large vessel occlusion or hemodynamically significant stenosis of the head or neck. 2. Chronic ischemic microangiopathy. Aortic Atherosclerosis (ICD10-I70.0). Electronically Signed   By: Ulyses Jarred M.D.   On: 09/16/2020 20:25   MR BRAIN WO CONTRAST  Result Date: 09/16/2020 CLINICAL DATA:  Left facial droop EXAM: MRI HEAD WITHOUT CONTRAST TECHNIQUE: Multiplanar, multiecho pulse sequences of the brain and surrounding  structures were obtained without intravenous contrast. COMPARISON:  None. 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 No acute or chronic hemorrhage. There is multifocal hyperintense T2-weighted signal within the white matter. Generalized volume loss without a clear lobar predilection. The midline structures are normal. Vascular: Major flow voids are preserved. Skull and upper cervical spine: Normal calvarium and skull base. Visualized upper cervical spine and soft tissues are normal. Sinuses/Orbits:No paranasal sinus fluid levels or advanced mucosal thickening. No mastoid or middle ear effusion. Normal orbits. 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 No hemorrhage or mass effect. Electronically Signed   By: Ulyses Jarred M.D.   On: 09/16/2020 23:26    Procedures Procedures   Medications Ordered in ED Medications  acetaminophen (TYLENOL) tablet 650 mg (has no administration in time range)    Or  acetaminophen (TYLENOL) 160 MG/5ML solution 650 mg (has no administration in time range)    Or  acetaminophen (TYLENOL) suppository 650 mg (has no administration in time range)  senna-docusate (Senokot-S) tablet 1 tablet (has no administration in time range)  enoxaparin (LOVENOX) injection 40 mg (40 mg Subcutaneous Given 09/16/20 2335)  0.9 %  sodium chloride infusion ( Intravenous Started During Downtime 09/16/20 2315)  aspirin chewable tablet 81 mg (has no administration in time range)  amLODipine (NORVASC) tablet 5 mg (5 mg Oral Given 09/16/20 2334)  hydrochlorothiazide (MICROZIDE) capsule 12.5 mg (12.5 mg Oral Given 09/16/20 2334)  atorvastatin (LIPITOR) tablet 40 mg (has no administration in time range)  iohexol (OMNIPAQUE) 350 MG/ML injection 100 mL (100 mLs Intravenous Contrast Given 09/16/20 1959)  aspirin EC tablet 325 mg (325 mg Oral Given 09/16/20 2102)   stroke: mapping our early stages of recovery book ( Does not apply Given 09/16/20 2333)    ED  Course  I have reviewed the triage vital signs and the nursing notes.  Pertinent labs & imaging results that were available during my care of the patient were reviewed by me and considered in my medical decision making (see chart for details).  Clinical Course as of 09/17/20 0851  Fri Sep 16, 2020  1905 Discussed with teleneurology on-call Dr.Khaliqdina who feels the patient would benefit from a stroke work-up including admission, CT head, CTA head and neck, and MRI.  Probably [facility] [MB]  2034 Viewed results with patient.  He is comfortable with patient to the hospital for further work-up. [MB]  2057 Discussed with Dr. Linda Hedges [facility] hospitalist who will evaluate the patient for admission. [MB]    Clinical Course User Index [MB] Hayden Rasmussen, MD   MDM Rules/Calculators/A&P                         This patient complains of facial droop slurred speech; this involves an extensive number of treatment Options and is a complaint that carries with it a high risk of complications and Morbidity. The differential includes Bell's, stroke, TIA,  hypoglycemia, metabolic derangement hypertensive urgency/emergency  I ordered, reviewed and interpreted labs, which included CBC with normal white count normal hemoglobin, chemistries normal glucose normal, COVID testing negative I ordered medication oral aspirin I ordered imaging studies which included CTA head and neck and I independently    visualized and interpreted imaging which showed no acute findings Previous records obtained and reviewed in epic, no recent missions I consulted Dr. Lorrin Goodell neurology and discussed lab and imaging findings  Critical Interventions: None  After the interventions stated above, I reevaluated the patient and found patient still to have some facial asymmetry although speech is normal on my exam.  Reviewed neurology recommendations with him and he is comfortable plan for admission to the hospital.  Discussed with  Dr. Linda Hedges who will admit the patient and arrange for MRI.    Final Clinical Impression(s) / ED Diagnoses Final diagnoses:  Facial droop  Slurred speech  Hypertension, unspecified type  Reviewed  Rx / DC Orders ED Discharge Orders    None       Hayden Rasmussen, MD 09/17/20 [phone]

## 2020-09-16 NOTE — ED Notes (Signed)
ED TO INPATIENT HANDOFF REPORT  Name/Age/Gender Connor Phillips 60 y.o. male  Code Status    Code Status Orders  (From admission, onward)         Start     Ordered   09/16/20 2128  Full code  Continuous        09/16/20 2129        Code Status History    This patient has a current code status but no historical code status.   Advance Care Planning Activity    Advance Directive Documentation   Flowsheet Row Most Recent Value  Type of Advance Directive Healthcare Power of Attorney, Living will  Pre-existing out of facility DNR order (yellow form or pink MOST form) --  "MOST" Form in Place? --      Home/SNF/Other Home  Chief Complaint Dysarthria [R47.1]  Level of Care/Admitting Diagnosis ED Disposition    ED Disposition Condition [place]: [place] [[id number]]  Level of Care: Telemetry [5]  Admit to tele based on following criteria: Other see comments  Comments: TIA  Covid Evaluation: Confirmed COVID Negative  Diagnosis: Dysarthria [784.51.ICD-9-CM]  Admitting Physician: Neena Rhymes [place]  Attending Physician: Neena Rhymes [[id number]]       Medical History Past Medical History:  Diagnosis Date  . Hypertension    controlled by diet and exercise    Allergies No Known Allergies  IV Location/Drains/Wounds Patient Lines/Drains/Airways Status    Active Line/Drains/Airways    Name Placement date Placement time Site Days   Peripheral IV 09/16/20 Right Antecubital 09/16/20  1926  Antecubital  less than 1          Labs/Imaging Results for orders placed or performed during the hospital encounter of 09/16/20 (from the past 48 hour(s))  Resp Panel by RT-PCR (Flu A&B, Covid) Nasopharyngeal Swab     Status: None   Collection Time: 09/16/20  7:02 PM   Specimen: Nasopharyngeal Swab; Nasopharyngeal(NP) swabs in vial transport medium  Result Value Ref Range   SARS Coronavirus 2 by RT PCR NEGATIVE NEGATIVE     Comment: (NOTE) SARS-CoV-2 target nucleic acids are NOT DETECTED.  The SARS-CoV-2 RNA is generally detectable in upper respiratory specimens during the acute phase of infection. The lowest concentration of SARS-CoV-2 viral copies this assay can detect is 138 copies/mL. A negative result does not preclude SARS-Cov-2 infection and should not be used as the sole basis for treatment or other patient management decisions. A negative result may occur with  improper specimen collection/handling, submission of specimen other than nasopharyngeal swab, presence of viral mutation(s) within the areas targeted by this assay, and inadequate number of viral copies(<138 copies/mL). A negative result must be combined with clinical observations, patient history, and epidemiological information. The expected result is Negative.  Fact Sheet for Patients:  EntrepreneurPulse.com.au  Fact Sheet for Healthcare Providers:  IncredibleEmployment.be  This test is no t yet approved or cleared by the [place] FDA and  has been authorized for detection and/or diagnosis of SARS-CoV-2 by FDA under an Emergency Use Authorization (EUA). This EUA will remain  in effect (meaning this test can be used) for the duration of the COVID-19 declaration under Section 564(b)(1) of the Act, 21 U.S.C.section 360bbb-3(b)(1), unless the authorization is terminated  or revoked sooner.       Influenza A by PCR NEGATIVE NEGATIVE   Influenza B by PCR NEGATIVE NEGATIVE    Comment: (NOTE) The Xpert Xpress SARS-CoV-2/FLU/RSV plus assay  is intended as an aid in the diagnosis of influenza from Nasopharyngeal swab specimens and should not be used as a sole basis for treatment. Nasal washings and aspirates are unacceptable for Xpert Xpress SARS-CoV-2/FLU/RSV testing.  Fact Sheet for Patients: EntrepreneurPulse.com.au  Fact Sheet for Healthcare  Providers: IncredibleEmployment.be  This test is not yet approved or cleared by the [place] FDA and has been authorized for detection and/or diagnosis of SARS-CoV-2 by FDA under an Emergency Use Authorization (EUA). This EUA will remain in effect (meaning this test can be used) for the duration of the COVID-19 declaration under Section 564(b)(1) of the Act, 21 U.S.C. section 360bbb-3(b)(1), unless the authorization is terminated or revoked.  Performed at [facility], [place] [address] [place], [place] [address]   Ginger Carne 8, ED     Status: None   Collection Time: 09/16/20  7:39 PM  Result Value Ref Range   Sodium 143 135 - 145 mmol/L   Potassium 3.8 3.5 - 5.1 mmol/L   Chloride 107 98 - 111 mmol/L   BUN 19 6 - 20 mg/dL   Creatinine, Ser 1.00 0.61 - 1.24 mg/dL   Glucose, Bld 89 70 - 99 mg/dL    Comment: Glucose reference range applies only to samples taken after fasting for at least 8 hours.   Calcium, Ion 1.18 1.15 - 1.40 mmol/L   TCO2 24 22 - 32 mmol/L   Hemoglobin 16.7 13.0 - 17.0 g/dL   HCT 49.0 39.0 - 52.0 %  Ethanol     Status: None   Collection Time: 09/16/20  7:47 PM  Result Value Ref Range   Alcohol, Ethyl (B) <10 <10 mg/dL    Comment: (NOTE) Lowest detectable limit for serum alcohol is 10 mg/dL.  For medical purposes only. Performed at [facility], [place] [address], [place], [place] [address]   Protime-INR     Status: None   Collection Time: 09/16/20  7:47 PM  Result Value Ref Range   Prothrombin Time 12.3 11.4 - 15.2 seconds   INR 0.9 0.8 - 1.2    Comment: (NOTE) INR goal varies based on device and disease states. Performed at [facility], [place] [address] [place], [place] [address]   APTT     Status: None   Collection Time: 09/16/20  7:47 PM  Result Value Ref Range   aPTT 27 24 - 36 seconds    Comment: Performed at [facility], [place] [address],  [place], [place] [address]  CBC     Status: None   Collection Time: 09/16/20  7:47 PM  Result Value Ref Range   WBC 7.2 4.0 - 10.5 K/uL   RBC 5.34 4.22 - 5.81 MIL/uL   Hemoglobin 17.0 13.0 - 17.0 g/dL   HCT 49.5 39.0 - 52.0 %   MCV 92.7 80.0 - 100.0 fL   MCH 31.8 26.0 - 34.0 pg   MCHC 34.3 30.0 - 36.0 g/dL   RDW 12.5 11.5 - 15.5 %   Platelets 229 150 - 400 K/uL   nRBC 0.0 0.0 - 0.2 %    Comment: Performed at [facility], [place] [address], [place],  [address]  Differential     Status: None   Collection Time: 09/16/20  7:47 PM  Result Value Ref Range   Neutrophils Relative % 61 %   Neutro Abs 4.4 1.7 - 7.7 K/uL   Lymphocytes Relative 24 %   Lymphs Abs 1.8 0.7 -  4.0 K/uL   Monocytes Relative 10 %   Monocytes Absolute 0.7 0.1 - 1.0 K/uL   Eosinophils Relative 3 %   Eosinophils Absolute 0.2 0.0 - 0.5 K/uL   Basophils Relative 1 %   Basophils Absolute 0.1 0.0 - 0.1 K/uL   Immature Granulocytes 1 %   Abs Immature Granulocytes 0.05 0.00 - 0.07 K/uL    Comment: Performed at [facility], [place] [address], [place], [place] [address]  Comprehensive metabolic panel     Status: None   Collection Time: 09/16/20  7:47 PM  Result Value Ref Range   Sodium 141 135 - 145 mmol/L   Potassium 3.8 3.5 - 5.1 mmol/L   Chloride 107 98 - 111 mmol/L   CO2 24 22 - 32 mmol/L   Glucose, Bld 89 70 - 99 mg/dL    Comment: Glucose reference range applies only to samples taken after fasting for at least 8 hours.   BUN 17 6 - 20 mg/dL   Creatinine, Ser 0.97 0.61 - 1.24 mg/dL   Calcium 9.0 8.9 - 10.3 mg/dL   Total Protein 7.8 6.5 - 8.1 g/dL   Albumin 4.1 3.5 - 5.0 g/dL   AST 26 15 - 41 U/L   ALT 23 0 - 44 U/L   Alkaline Phosphatase 76 38 - 126 U/L   Total Bilirubin 0.6 0.3 - 1.2 mg/dL   GFR, Estimated >60 >60 mL/min    Comment: (NOTE) Calculated using the CKD-EPI Creatinine Equation (2021)    Anion gap 10 5 - 15    Comment: Performed at [facility], [place] [address], [place], [place] [address]  Urine rapid drug screen (hosp performed)     Status: None   Collection Time: 09/16/20  8:45 PM  Result Value Ref Range   Opiates NONE DETECTED NONE DETECTED   Cocaine NONE DETECTED NONE DETECTED   Benzodiazepines NONE DETECTED NONE DETECTED   Amphetamines NONE DETECTED NONE DETECTED   Tetrahydrocannabinol NONE DETECTED NONE DETECTED   Barbiturates NONE DETECTED NONE DETECTED    Comment: (NOTE) DRUG SCREEN FOR MEDICAL PURPOSES ONLY.  IF CONFIRMATION IS NEEDED FOR ANY PURPOSE, NOTIFY LAB WITHIN 5 DAYS.  LOWEST DETECTABLE LIMITS FOR URINE DRUG SCREEN Drug Class                     Cutoff (ng/mL) Amphetamine and metabolites    1000 Barbiturate and metabolites    200 Benzodiazepine                 696 Tricyclics and metabolites     300 Opiates and metabolites        300 Cocaine and metabolites        300 THC                            50 Performed at [facility], [place] [address], [place], [place] [address]    CT Angio Head W/Cm &/Or Wo Cm  Result Date: 09/16/2020 CLINICAL DATA:  Right facial droop and slurred speech EXAM: CT ANGIOGRAPHY HEAD AND NECK TECHNIQUE: Multidetector CT imaging of the head and neck was performed using the standard protocol during bolus administration of intravenous contrast. Multiplanar CT image reconstructions and MIPs were obtained to evaluate the vascular anatomy. Carotid stenosis measurements (when applicable) are obtained utilizing NASCET criteria, using the distal internal carotid diameter as the denominator. CONTRAST:  157mL OMNIPAQUE IOHEXOL  350 MG/ML SOLN COMPARISON:  None. FINDINGS: CT HEAD FINDINGS Brain: There is no mass, hemorrhage or extra-axial collection. The size and configuration of the ventricles and extra-axial CSF spaces are normal. There is hypoattenuation of the periventricular white matter, most commonly indicating chronic ischemic microangiopathy. Skull: The  visualized skull base, calvarium and extracranial soft tissues are normal. Sinuses/Orbits: No fluid levels or advanced mucosal thickening of the visualized paranasal sinuses. No mastoid or middle ear effusion. The orbits are normal. CTA NECK FINDINGS SKELETON: There is no bony spinal canal stenosis. No lytic or blastic lesion. OTHER NECK: Normal pharynx, larynx and major salivary glands. No cervical lymphadenopathy. Unremarkable thyroid gland. UPPER CHEST: No pneumothorax or pleural effusion. No nodules or masses. AORTIC ARCH: There is calcific atherosclerosis of the aortic arch. There is no aneurysm, dissection or hemodynamically significant stenosis of the visualized portion of the aorta. Conventional 3 vessel aortic branching pattern. The visualized proximal subclavian arteries are widely patent. RIGHT CAROTID SYSTEM: No dissection, occlusion or aneurysm. Mild atherosclerotic calcification at the carotid bifurcation without hemodynamically significant stenosis. LEFT CAROTID SYSTEM: No dissection, occlusion or aneurysm. Mild atherosclerotic calcification at the carotid bifurcation without hemodynamically significant stenosis. VERTEBRAL ARTERIES: Left dominant configuration. Both origins are clearly patent. There is no dissection, occlusion or flow-limiting stenosis to the skull base (V1-V3 segments). CTA HEAD FINDINGS POSTERIOR CIRCULATION: --Vertebral arteries: Normal V4 segments. --Inferior cerebellar arteries: Normal. --Basilar artery: Normal. --Superior cerebellar arteries: Normal. --Posterior cerebral arteries (PCA): Normal. ANTERIOR CIRCULATION: --Intracranial internal carotid arteries: Normal. --Anterior cerebral arteries (ACA): Normal. Both A1 segments are present. Patent anterior communicating artery (a-comm). --Middle cerebral arteries (MCA): Normal. VENOUS SINUSES: As permitted by contrast timing, patent. ANATOMIC VARIANTS: None Review of the MIP images confirms the above findings. IMPRESSION: 1. No  emergent large vessel occlusion or hemodynamically significant stenosis of the head or neck. 2. Chronic ischemic microangiopathy. Aortic Atherosclerosis (ICD10-I70.0). Electronically Signed   By: Ulyses Jarred M.D.   On: 09/16/2020 20:25   CT Angio Neck W and/or Wo Contrast  Result Date: 09/16/2020 CLINICAL DATA:  Right facial droop and slurred speech EXAM: CT ANGIOGRAPHY HEAD AND NECK TECHNIQUE: Multidetector CT imaging of the head and neck was performed using the standard protocol during bolus administration of intravenous contrast. Multiplanar CT image reconstructions and MIPs were obtained to evaluate the vascular anatomy. Carotid stenosis measurements (when applicable) are obtained utilizing NASCET criteria, using the distal internal carotid diameter as the denominator. CONTRAST:  134mL OMNIPAQUE IOHEXOL 350 MG/ML SOLN COMPARISON:  None. FINDINGS: CT HEAD FINDINGS Brain: There is no mass, hemorrhage or extra-axial collection. The size and configuration of the ventricles and extra-axial CSF spaces are normal. There is hypoattenuation of the periventricular white matter, most commonly indicating chronic ischemic microangiopathy. Skull: The visualized skull base, calvarium and extracranial soft tissues are normal. Sinuses/Orbits: No fluid levels or advanced mucosal thickening of the visualized paranasal sinuses. No mastoid or middle ear effusion. The orbits are normal. CTA NECK FINDINGS SKELETON: There is no bony spinal canal stenosis. No lytic or blastic lesion. OTHER NECK: Normal pharynx, larynx and major salivary glands. No cervical lymphadenopathy. Unremarkable thyroid gland. UPPER CHEST: No pneumothorax or pleural effusion. No nodules or masses. AORTIC ARCH: There is calcific atherosclerosis of the aortic arch. There is no aneurysm, dissection or hemodynamically significant stenosis of the visualized portion of the aorta. Conventional 3 vessel aortic branching pattern. The visualized proximal subclavian  arteries are widely patent. RIGHT CAROTID SYSTEM: No dissection, occlusion or aneurysm. Mild atherosclerotic calcification at the  carotid bifurcation without hemodynamically significant stenosis. LEFT CAROTID SYSTEM: No dissection, occlusion or aneurysm. Mild atherosclerotic calcification at the carotid bifurcation without hemodynamically significant stenosis. VERTEBRAL ARTERIES: Left dominant configuration. Both origins are clearly patent. There is no dissection, occlusion or flow-limiting stenosis to the skull base (V1-V3 segments). CTA HEAD FINDINGS POSTERIOR CIRCULATION: --Vertebral arteries: Normal V4 segments. --Inferior cerebellar arteries: Normal. --Basilar artery: Normal. --Superior cerebellar arteries: Normal. --Posterior cerebral arteries (PCA): Normal. ANTERIOR CIRCULATION: --Intracranial internal carotid arteries: Normal. --Anterior cerebral arteries (ACA): Normal. Both A1 segments are present. Patent anterior communicating artery (a-comm). --Middle cerebral arteries (MCA): Normal. VENOUS SINUSES: As permitted by contrast timing, patent. ANATOMIC VARIANTS: None Review of the MIP images confirms the above findings. IMPRESSION: 1. No emergent large vessel occlusion or hemodynamically significant stenosis of the head or neck. 2. Chronic ischemic microangiopathy. Aortic Atherosclerosis (ICD10-I70.0). Electronically Signed   By: Ulyses Jarred M.D.   On: 09/16/2020 20:25    Pending Labs Unresulted Labs (From admission, onward)          Start     Ordered   09/23/20 0500  Creatinine, serum  (enoxaparin (LOVENOX)    CrCl >/= 30 ml/min)  Weekly,   R     Comments: while on enoxaparin therapy    09/16/20 2129   09/17/20 0500  Hemoglobin A1c  (Labs)  Tomorrow morning,   R        09/16/20 2129   09/17/20 0500  Lipid panel  (Labs)  Tomorrow morning,   R       Comments: Fasting    09/16/20 2129   09/16/20 2126  HIV Antibody (routine testing w rflx)  (HIV Antibody (Routine testing w reflex) panel)   Once,   STAT        09/16/20 2129   09/16/20 1902  Urinalysis, Routine w reflex microscopic Urine, Clean Catch  ONCE - STAT,   STAT        09/16/20 1903          Vitals/Pain Today's Vitals   09/16/20 2012 09/16/20 2030 09/16/20 2045 09/16/20 2115  BP: (!) 188/129 (!) 172/114 (!) 180/126 (!) 163/121  Pulse: 88 83 80 85  Resp: 19  17 17   Temp:      TempSrc:      SpO2: 98% 97% 96% 95%  Weight:      Height:      PainSc:        Isolation Precautions No active isolations  Medications Medications   stroke: mapping our early stages of recovery book (has no administration in time range)  acetaminophen (TYLENOL) tablet 650 mg (has no administration in time range)    Or  acetaminophen (TYLENOL) 160 MG/5ML solution 650 mg (has no administration in time range)    Or  acetaminophen (TYLENOL) suppository 650 mg (has no administration in time range)  senna-docusate (Senokot-S) tablet 1 tablet (has no administration in time range)  enoxaparin (LOVENOX) injection 40 mg (has no administration in time range)  0.9 %  sodium chloride infusion (has no administration in time range)  iohexol (OMNIPAQUE) 350 MG/ML injection 100 mL (100 mLs Intravenous Contrast Given 09/16/20 1959)  aspirin EC tablet 325 mg (325 mg Oral Given 09/16/20 2102)    Mobility walks

## 2020-09-16 NOTE — ED Notes (Signed)
Patient transported to MRI 

## 2020-09-17 ENCOUNTER — Observation Stay (HOSPITAL_BASED_OUTPATIENT_CLINIC_OR_DEPARTMENT_OTHER): Payer: 59

## 2020-09-17 DIAGNOSIS — I6389 Other cerebral infarction: Secondary | ICD-10-CM

## 2020-09-17 DIAGNOSIS — I10 Essential (primary) hypertension: Secondary | ICD-10-CM | POA: Diagnosis not present

## 2020-09-17 DIAGNOSIS — I639 Cerebral infarction, unspecified: Secondary | ICD-10-CM

## 2020-09-17 LAB — LIPID PANEL
Cholesterol: 251 mg/dL — ABNORMAL HIGH (ref 0–200)
HDL: 42 mg/dL (ref >40)
LDL Cholesterol: 177 mg/dL — ABNORMAL HIGH (ref 0–99)
Total CHOL/HDL Ratio: 6 RATIO
Triglycerides: 160 mg/dL — ABNORMAL HIGH (ref <150)
VLDL: 32 mg/dL (ref 0–40)

## 2020-09-17 LAB — ECHOCARDIOGRAM COMPLETE
Area-P 1/2: 3.21 cm2
Height: 66 in
S' Lateral: 3.3 cm
Weight: 2712 oz

## 2020-09-17 LAB — HIV ANTIBODY (ROUTINE TESTING W REFLEX): HIV Screen 4th Generation wRfx: NONREACTIVE

## 2020-09-17 MED ORDER — ASPIRIN 81 MG PO CHEW: 81.0000 mg | CHEWABLE_TABLET | Freq: Every day | ORAL | 0 refills | Status: AC

## 2020-09-17 MED ORDER — AMLODIPINE BESYLATE 5 MG PO TABS: 5.0000 mg | ORAL_TABLET | Freq: Every day | ORAL | 0 refills | Status: DC

## 2020-09-17 MED ORDER — ATORVASTATIN CALCIUM 40 MG PO TABS
40.0000 mg | ORAL_TABLET | Freq: Every day | ORAL | Status: DC
  Administered 2020-09-17: 40 mg via ORAL
  Filled 2020-09-17: qty 1

## 2020-09-17 MED ORDER — ATORVASTATIN CALCIUM 40 MG PO TABS: 80.0000 mg | ORAL_TABLET | Freq: Every day | ORAL | 0 refills | Status: AC

## 2020-09-17 NOTE — Discharge Instructions (Signed)
Blood Pressure Record Sheet To take your blood pressure, you will need a blood pressure machine. You can buy a blood pressure machine (blood pressure monitor) at your clinic, drug store, or online. When choosing one, consider:  An automatic monitor that has an arm cuff.  A cuff that wraps snugly around your upper arm. You should be able to fit only one finger between your arm and the cuff.  A device that stores blood pressure reading results.  Do not choose a monitor that measures your blood pressure from your wrist or finger. Follow your health care provider's instructions for how to take your blood pressure. To use this form:  Get one reading in the morning (a.m.) before you take any medicines.  Get one reading in the evening (p.m.) before supper.  Take at least 2 readings with each blood pressure check. This makes sure the results are correct. Wait 1-2 minutes between measurements.  Write down the results in the spaces on this form.  Repeat this once a week, or as told by your health care provider.  Make a follow-up appointment with your health care provider to discuss the results. Blood pressure log Date: _______________________  a.m. _____________________(1st reading) _____________________(2nd reading)  p.m. _____________________(1st reading) _____________________(2nd reading) Date: _______________________  a.m. _____________________(1st reading) _____________________(2nd reading)  p.m. _____________________(1st reading) _____________________(2nd reading) Date: _______________________  a.m. _____________________(1st reading) _____________________(2nd reading)  p.m. _____________________(1st reading) _____________________(2nd reading) Date: _______________________  a.m. _____________________(1st reading) _____________________(2nd reading)  p.m. _____________________(1st reading) _____________________(2nd reading) Date: _______________________  a.m.  _____________________(1st reading) _____________________(2nd reading)  p.m. _____________________(1st reading) _____________________(2nd reading) This information is not intended to replace advice given to you by your health care provider. Make sure you discuss any questions you have with your health care provider. Document Revised: 09/09/2019 Document Reviewed: 09/09/2019 Elsevier Patient Education  2021 [place]. How to Take Your Blood Pressure Blood pressure is a measurement of how strongly your blood is pressing against the walls of your arteries. Arteries are blood vessels that carry blood from your heart throughout your body. Your health care provider takes your blood pressure at each office visit. You can also take your own blood pressure at home with a blood pressure monitor.   Hypertension, Adult High blood pressure (hypertension) is when the force of blood pumping through the arteries is too strong. The arteries are the blood vessels that carry blood from the heart throughout the body. Hypertension forces the heart to work harder to pump blood and may cause arteries to become narrow or stiff. Untreated or uncontrolled hypertension can cause a heart attack, heart failure, a stroke, kidney disease, and other problems. A blood pressure reading consists of a higher number over a lower number. Ideally, your blood pressure should be below 120/80. The first ("top") number is called the systolic pressure. It is a measure of the pressure in your arteries as your heart beats. The second ("bottom") number is called the diastolic pressure. It is a measure of the pressure in your arteries as the heart relaxes. What are the causes? The exact cause of this condition is not known. There are some conditions that result in or are related to high blood pressure. What increases the risk? Some risk factors for high blood pressure are under your control. The following factors may make you more likely to  develop this condition:  Smoking.  Having type 2 diabetes mellitus, high cholesterol, or both.  Not getting enough exercise or physical activity.  Being overweight.  Having too  much fat, sugar, calories, or salt (sodium) in your diet.  Drinking too much alcohol. Some risk factors for high blood pressure may be difficult or impossible to change. Some of these factors include:  Having chronic kidney disease.  Having a family history of high blood pressure.  Age. Risk increases with age.  Race. You may be at higher risk if you are African American.  Gender. Men are at higher risk than women before age 85. After age 30, women are at higher risk than men.  Having obstructive sleep apnea.  Stress. What are the signs or symptoms? High blood pressure may not cause symptoms. Very high blood pressure (hypertensive crisis) may cause:  Headache.  Anxiety.  Shortness of breath.  Nosebleed.  Nausea and vomiting.  Vision changes.  Severe chest pain.  Seizures. How is this diagnosed? This condition is diagnosed by measuring your blood pressure while you are seated, with your arm resting on a flat surface, your legs uncrossed, and your feet flat on the floor. The cuff of the blood pressure monitor will be placed directly against the skin of your upper arm at the level of your heart. It should be measured at least twice using the same arm. Certain conditions can cause a difference in blood pressure between your right and left arms. Certain factors can cause blood pressure readings to be lower or higher than normal for a short period of time:  When your blood pressure is higher when you are in a health care provider's office than when you are at home, this is called white coat hypertension. Most people with this condition do not need medicines.  When your blood pressure is higher at home than when you are in a health care provider's office, this is called masked hypertension. Most  people with this condition may need medicines to control blood pressure. If you have a high blood pressure reading during one visit or you have normal blood pressure with other risk factors, you may be asked to:  Return on a different day to have your blood pressure checked again.  Monitor your blood pressure at home for 1 week or longer. If you are diagnosed with hypertension, you may have other blood or imaging tests to help your health care provider understand your overall risk for other conditions. How is this treated? This condition is treated by making healthy lifestyle changes, such as eating healthy foods, exercising more, and reducing your alcohol intake. Your health care provider may prescribe medicine if lifestyle changes are not enough to get your blood pressure under control, and if:  Your systolic blood pressure is above 130.  Your diastolic blood pressure is above 80. Your personal target blood pressure may vary depending on your medical conditions, your age, and other factors. Follow these instructions at home: Eating and drinking  Eat a diet that is high in fiber and potassium, and low in sodium, added sugar, and fat. An example eating plan is called the DASH (Dietary Approaches to Stop Hypertension) diet. To eat this way: ? Eat plenty of fresh fruits and vegetables. Try to fill one half of your plate at each meal with fruits and vegetables. ? Eat whole grains, such as whole-wheat pasta, brown rice, or whole-grain bread. Fill about one fourth of your plate with whole grains. ? Eat or drink low-fat dairy products, such as skim milk or low-fat yogurt. ? Avoid fatty cuts of meat, processed or cured meats, and poultry with skin. Fill about one fourth  of your plate with lean proteins, such as fish, chicken without skin, beans, eggs, or tofu. ? Avoid pre-made and processed foods. These tend to be higher in sodium, added sugar, and fat.  Reduce your daily sodium intake. Most people  with hypertension should eat less than 1,500 mg of sodium a day.  Do not drink alcohol if: ? Your health care provider tells you not to drink. ? You are pregnant, may be pregnant, or are planning to become pregnant.  If you drink alcohol: ? Limit how much you use to:  0-1 drink a day for women.  0-2 drinks a day for men. ? Be aware of how much alcohol is in your drink. In the [place] one drink equals one 12 oz bottle of beer (355 mL), one 5 oz glass of wine (148 mL), or one 1 oz glass of hard liquor (44 mL).   Lifestyle  Work with your health care provider to maintain a healthy body weight or to lose weight. Ask what an ideal weight is for you.  Get at least 30 minutes of exercise most days of the week. Activities may include walking, swimming, or biking.  Include exercise to strengthen your muscles (resistance exercise), such as Pilates or lifting weights, as part of your weekly exercise routine. Try to do these types of exercises for 30 minutes at least 3 days a week.  Do not use any products that contain nicotine or tobacco, such as cigarettes, e-cigarettes, and chewing tobacco. If you need help quitting, ask your health care provider.  Monitor your blood pressure at home as told by your health care provider.  Keep all follow-up visits as told by your health care provider. This is important.   Medicines  Take over-the-counter and prescription medicines only as told by your health care provider. Follow directions carefully. Blood pressure medicines must be taken as prescribed.  Do not skip doses of blood pressure medicine. Doing this puts you at risk for problems and can make the medicine less effective.  Ask your health care provider about side effects or reactions to medicines that you should watch for. Contact a health care provider if you:  Think you are having a reaction to a medicine you are taking.  Have headaches that keep coming back (recurring).  Feel dizzy.  Have  swelling in your ankles.  Have trouble with your vision. Get help right away if you:  Develop a severe headache or confusion.  Have unusual weakness or numbness.  Feel faint.  Have severe pain in your chest or abdomen.  Vomit repeatedly.  Have trouble breathing. Summary  Hypertension is when the force of blood pumping through your arteries is too strong. If this condition is not controlled, it may put you at risk for serious complications.  Your personal target blood pressure may vary depending on your medical conditions, your age, and other factors. For most people, a normal blood pressure is less than 120/80.  Hypertension is treated with lifestyle changes, medicines, or a combination of both. Lifestyle changes include losing weight, eating a healthy, low-sodium diet, exercising more, and limiting alcohol. This information is not intended to replace advice given to you by your health care provider. Make sure you discuss any questions you have with your health care provider. Document Revised: 01/29/2018 Document Reviewed: 01/29/2018 Elsevier Patient Education  2021 [place] may need to take your own blood pressure to:  Confirm a diagnosis of high blood pressure (hypertension).  Monitor your blood  pressure over time.  Make sure your blood pressure medicine is working. Supplies needed:  Blood pressure monitor.  Dining room chair to sit in.  Table or desk.  Small notebook and pencil or pen. How to prepare To get the most accurate reading, avoid the following for 30 minutes before you check your blood pressure:  Drinking caffeine.  Drinking alcohol.  Eating.  Smoking.  Exercising. Five minutes before you check your blood pressure:  Use the bathroom and urinate so that you have an empty bladder.  Sit quietly in a dining room chair. Do not sit in a soft couch or an armchair. Do not talk. How to take your blood pressure To check your blood pressure,  follow the instructions in the manual that came with your blood pressure monitor. If you have a digital blood pressure monitor, the instructions may be as follows: 1. Sit up straight in a chair. 2. Place your feet on the floor. Do not cross your ankles or legs. 3. Rest your left arm at the level of your heart on a table or desk or on the arm of a chair. 4. Pull up your shirt sleeve. 5. Wrap the blood pressure cuff around the upper part of your left arm, 1 inch (2.5 cm) above your elbow. It is best to wrap the cuff around bare skin. 6. Fit the cuff snugly around your arm. You should be able to place only one finger between the cuff and your arm. 7. Position the cord so that it rests in the bend of your elbow. 8. Press the power button. 9. Sit quietly while the cuff inflates and deflates. 10. Read the digital reading on the monitor screen and write the numbers down (record them) in a notebook. 11. Wait 2-3 minutes, then repeat the steps, starting at step 1.   What does my blood pressure reading mean? A blood pressure reading consists of a higher number over a lower number. Ideally, your blood pressure should be below 120/80. The first ("top") number is called the systolic pressure. It is a measure of the pressure in your arteries as your heart beats. The second ("bottom") number is called the diastolic pressure. It is a measure of the pressure in your arteries as the heart relaxes. Blood pressure is classified into five stages. The following are the stages for adults who do not have a short-term serious illness or a chronic condition. Systolic pressure and diastolic pressure are measured in a unit called mm Hg (millimeters of mercury).  Normal  Systolic pressure: below 144.  Diastolic pressure: below 80. Elevated  Systolic pressure: 315-400.  Diastolic pressure: below 80. Hypertension stage 1  Systolic pressure: 867-619.  Diastolic pressure: 50-93. Hypertension stage 2  Systolic  pressure: 267 or above.  Diastolic pressure: 90 or above. You can have elevated blood pressure or hypertension even if only the systolic or only the diastolic number in your reading is higher than normal. Follow these instructions at home:  Check your blood pressure as often as recommended by your health care provider.  Check your blood pressure at the same time every day.  Take your monitor to the next appointment with your health care provider to make sure that: ? You are using it correctly. ? It provides accurate readings.  Be sure you understand what your goal blood pressure numbers are.  Tell your health care provider if you are having any side effects from blood pressure medicine.  Keep all follow-up visits as told by  your health care provider. This is important. General tips  Your health care provider can suggest a reliable monitor that will meet your needs. There are several types of home blood pressure monitors.  Choose a monitor that has an arm cuff. Do not choose a monitor that measures your blood pressure from your wrist or finger.  Choose a cuff that wraps snugly around your upper arm. You should be able to fit only one finger between your arm and the cuff.  You can buy a blood pressure monitor at most drugstores or online. Where to find more information American Heart Association: www.heart.org Contact a health care provider if:  Your blood pressure is consistently high. Get help right away if:  Your systolic blood pressure is higher than 180.  Your diastolic blood pressure is higher than 120. Summary  Blood pressure is a measurement of how strongly your blood is pressing against the walls of your arteries.  A blood pressure reading consists of a higher number over a lower number. Ideally, your blood pressure should be below 120/80.  Check your blood pressure at the same time every day.  Avoid caffeine, alcohol, smoking, and exercise for 30 minutes prior to  checking your blood pressure. These agents can affect the accuracy of the blood pressure reading. This information is not intended to replace advice given to you by your health care provider. Make sure you discuss any questions you have with your health care provider. Document Revised: 05/15/2019 Document Reviewed: 05/15/2019 Elsevier Patient Education  2021 [employer].

## 2020-09-17 NOTE — Progress Notes (Signed)
  Echocardiogram 2D Echocardiogram has been performed.  Randa Lynn Alexandr Yaworski 09/17/2020, 11:36 AM

## 2020-09-17 NOTE — Evaluation (Signed)
Speech Language Pathology Evaluation Patient Details Name: JARELLE ATES MRN: [id number] DOB: 01/01/1961 Today's Date: 09/17/2020 Time: 1220-1230 SLP Time Calculation (min) (ACUTE ONLY): 10 min  Problem List:  Patient Active Problem List   Diagnosis Date Noted  . Facial droop 09/16/2020  . Dysarthria 09/16/2020  . HTN (hypertension) 09/16/2020  . Shin splints 04/10/2012  . Knee pain, right 01/17/2012  . Leg length inequality 01/17/2012   Past Medical History:  Past Medical History:  Diagnosis Date  . Hypertension    controlled by diet and exercise   Past Surgical History:  Past Surgical History:  Procedure Laterality Date  . kidney stone removal     HPI:  FALON FLINCHUM is a 60 y.o. male with medical history significant of HTN not on medication reports that 09/15/20 he noticed a mild left facial droop vs swelling of the right face. He also noted speech changes. 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   Assessment / Plan / Recommendation Clinical Impression  Pt demonstrates very mild dysarthria due to left CN VII weakness. Pt and wife verblize an awareness of impairment though pt is 100% intelligible. Pt noted to have decreased tension of left cheek and lips for bilabial phonemes. SLP introduced concept of Slow Loud Overarticualted Pause speech with model. Pt return demonstrated while reading a passage with almost no distortion of sounds. Pt reports feeling self conscious of speech but understands the mild nature of impairment. Offered f/u with OP SLP if desired though education complete and further evidence based interventions are minimal. Recommend pt continue compensatory strategies, can seek f/u if desired.    SLP Assessment  SLP Recommendation/Assessment: Patient does not need any further Speech Lanaguage Pathology Services SLP Visit Diagnosis: Dysarthria and anarthria (R47.1)    Follow Up Recommendations       Frequency and Duration           SLP  Evaluation Cognition  Overall Cognitive Status: Within Functional Limits for tasks assessed       Comprehension  Auditory Comprehension Overall Auditory Comprehension: Appears within functional limits for tasks assessed Reading Comprehension Reading Status: Within funtional limits    Expression Verbal Expression Overall Verbal Expression: Appears within functional limits for tasks assessed   Oral / Motor  Oral Motor/Sensory Function Overall Oral Motor/Sensory Function: Within functional limits Motor Speech Overall Motor Speech: Impaired Respiration: Within functional limits Phonation: Normal Resonance: Within functional limits Articulation: Impaired Level of Impairment: Conversation Intelligibility: Intelligible Motor Planning: Witnin functional limits Motor Speech Errors: Aware;Consistent   GO                    Faolan [place], Katherene Ponto 09/17/2020, 1:23 PM

## 2020-09-17 NOTE — Plan of Care (Signed)
Pt to be discharge to home with self care/ family. Pt alert and oriented and verbalizes understanding with discharge instructions. All pt belongings collected with pt and taken home at time of discharge. Pt safety maintained.   Problem: Education: Goal: Knowledge of General Education information will improve Description: Including pain rating scale, medication(s)/side effects and non-pharmacologic comfort measures Outcome: Adequate for Discharge   Problem: Health Behavior/Discharge Planning: Goal: Ability to manage health-related needs will improve Outcome: Adequate for Discharge   Problem: Clinical Measurements: Goal: Ability to maintain clinical measurements within normal limits will improve Outcome: Adequate for Discharge Goal: Diagnostic test results will improve Outcome: Adequate for Discharge Goal: Cardiovascular complication will be avoided Outcome: Adequate for Discharge   Problem: Coping: Goal: Level of anxiety will decrease Outcome: Adequate for Discharge   Problem: Pain Managment: Goal: General experience of comfort will improve Outcome: Adequate for Discharge   Problem: Safety: Goal: Ability to remain free from injury will improve Outcome: Adequate for Discharge

## 2020-09-17 NOTE — Evaluation (Signed)
Physical Therapy Evaluation Patient Details Name: Connor Phillips MRN: [id number] DOB: [birth year] Today's Date: 09/17/2020   History of Present Illness  Pt is 60 yo male who presented with slurred speech and facial droop.  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  Has hx of HTN.  Clinical Impression  Pt admitted with small acute infarct.  His only symptoms were slurred speech and facial droop that have improved significantly per pt.  He has no strength, balance, coordination, vision, cognitive, or mobility deficits.  Demonstrated normal gait and mobility.  No PT needs. Safe to mobilize independently.     Follow Up Recommendations No PT follow up    Equipment Recommendations  None recommended by PT    Recommendations for Other Services       Precautions / Restrictions Precautions Precautions: None      Mobility  Bed Mobility Overal bed mobility: Independent                  Transfers Overall transfer level: Independent               General transfer comment: Had supervision during therapy but demonstrated safely  Ambulation/Gait Ambulation/Gait assistance: Independent Gait Distance (Feet): 200 Feet Assistive device: None Gait Pattern/deviations: WFL(Within Functional Limits)     General Gait Details: Had supervision during therapy but independent , normal gait  Stairs            Wheelchair Mobility    Modified Rankin (Stroke Patients Only) Modified Rankin (Stroke Patients Only) Pre-Morbid Rankin Score: No symptoms Modified Rankin: No symptoms     Balance Overall balance assessment: Independent   Sitting balance-Leahy Scale: Normal       Standing balance-Leahy Scale: Normal               High level balance activites: Side stepping;Backward walking;Direction changes;Turns;Sudden stops;Head turns High Level Balance Comments: Good balance, no deficits             Pertinent Vitals/Pain Pain  Assessment: No/denies pain    Home Living Family/patient expects to be discharged to:: Private residence Living Arrangements: Spouse/significant other Available Help at Discharge: Family;Available PRN/intermittently Type of Home: House Home Access: Stairs to enter Entrance Stairs-Rails: Psychiatric nurse of Steps: 8 Home Layout: One level Home Equipment: None      Prior Function Level of Independence: Independent         Comments: Completely independent; works; walks or runs 3 x week; exercises     Hand Dominance        Extremity/Trunk Assessment   Upper Extremity Assessment Upper Extremity Assessment: Overall WFL for tasks assessed    Lower Extremity Assessment Lower Extremity Assessment: Overall WFL for tasks assessed (ROM, strength, sensation, coordination all normal)    Cervical / Trunk Assessment Cervical / Trunk Assessment: Normal  Communication   Communication: No difficulties  Cognition Arousal/Alertness: Awake/alert Behavior During Therapy: WFL for tasks assessed/performed Overall Cognitive Status: Within Functional Limits for tasks assessed                                        General Comments      Exercises     Assessment/Plan    PT Assessment Patent does not need any further PT services  PT Problem List         PT Treatment Interventions  PT Goals (Current goals can be found in the Care Plan section)  Acute Rehab PT Goals Patient Stated Goal: return home PT Goal Formulation: All assessment and education complete, DC therapy    Frequency     Barriers to discharge        Co-evaluation               AM-PAC PT "6 Clicks" Mobility  Outcome Measure Help needed turning from your back to your side while in a flat bed without using bedrails?: None Help needed moving from lying on your back to sitting on the side of a flat bed without using bedrails?: None Help needed moving to and from a bed  to a chair (including a wheelchair)?: None Help needed standing up from a chair using your arms (e.g., wheelchair or bedside chair)?: None Help needed to walk in hospital room?: None Help needed climbing 3-5 steps with a railing? : None 6 Click Score: 24    End of Session   Activity Tolerance: Patient tolerated treatment well Patient left: in chair;with call bell/phone within reach (safe to mobilize independently) Nurse Communication: Mobility status      Time: 5400-8676 PT Time Calculation (min) (ACUTE ONLY): 18 min   Charges:   PT Evaluation $PT Eval Low Complexity: 1 Low          Dorie Ohms, PT Acute Rehab Services Pager [phone] [facility] [place] 09/17/2020, 10:13 AM

## 2020-09-17 NOTE — Plan of Care (Signed)
  Problem: Education: Goal: Knowledge of General Education information will improve Description: Including pain rating scale, medication(s)/side effects and non-pharmacologic comfort measures Outcome: Progressing   Problem: Health Behavior/Discharge Planning: Goal: Ability to manage health-related needs will improve Outcome: Progressing   Problem: Clinical Measurements: Goal: Ability to maintain clinical measurements within normal limits will improve Outcome: Progressing Goal: Diagnostic test results will improve Outcome: Progressing Goal: Cardiovascular complication will be avoided Outcome: Progressing   Problem: Coping: Goal: Level of anxiety will decrease Outcome: Progressing   Problem: Pain Managment: Goal: General experience of comfort will improve Outcome: Progressing   Problem: Safety: Goal: Ability to remain free from injury will improve Outcome: Progressing

## 2020-09-17 NOTE — Consult Note (Signed)
Neurology Consultation Reason for Consult: Stroke Referring Physician: Spongberg, C  CC: Facial droop  History is obtained from: Patient  HPI: Connor Phillips is a 60 y.o. male who initially noticed that he was slurring his speech some on Thursday.  By Friday, his wife was telling him to come to the emergency department because of facial drooping and therefore he sought care and evaluation in the [facility] long emergency department.  He was admitted and an MRI was obtained which shows a subcortical ischemic infarct.  He denies change in vision, numbness, weakness of his arms or legs, or any other symptoms besides slurred speech and facial weakness   LKW: Wednesday night prior to bed tpa given?: no, out of window NIH stroke scale: Two    ROS: A 14 point ROS was performed and is negative except as noted in the HPI.   Past Medical History:  Diagnosis Date  . Hypertension    controlled by diet and exercise     Family History  Problem Relation Age of Onset  . Colon cancer Neg Hx   . Esophageal cancer Neg Hx   . Rectal cancer Neg Hx   . Stomach cancer Neg Hx      Social History:  reports that he quit smoking about 9 years ago. He has never used smokeless tobacco. He reports current alcohol use of about 1.0 - 2.0 standard drink of alcohol per week. He reports that he does not use drugs.   Exam: Current vital signs: BP (!) 146/93 (BP Location: Left Arm)   Pulse 69   Temp 97.7 F (36.5 C)   Resp 20   Ht 5\' 6"  (1.676 m)   Wt 76.9 kg   SpO2 94%   BMI 27.36 kg/m  Vital signs in last 24 hours: Temp:  [97.7 F (36.5 C)-98.5 F (36.9 C)] 97.7 F (36.5 C) (04/16 1315) Pulse Rate:  [62-108] 69 (04/16 1315) Resp:  [16-22] 20 (04/16 1315) BP: (135-198)/(93-130) 146/93 (04/16 1315) SpO2:  [93 %-98 %] 94 % (04/16 1315) Weight:  [76.9 kg-79.4 kg] 76.9 kg (04/15 2305)   Physical Exam  Constitutional: Appears well-developed and well-nourished.  Psych: Affect appropriate to  situation Eyes: No scleral injection HENT: No OP obstruction MSK: no joint deformities.  Cardiovascular: Normal rate and regular rhythm.  Respiratory: Effort normal, non-labored breathing GI: Soft.  No distension. There is no tenderness.  Skin: WDI  Neuro: Mental Status: Patient is awake, alert, oriented to person, place, month, year, and situation. Patient is able to give a clear and coherent history. No signs of aphasia or neglect He is mildly dysarthric Cranial Nerves: II: Visual Fields are full. Pupils are equal, round, and reactive to light.   III,IV, VI: EOMI without ptosis or diploplia.  V: Facial sensation is symmetric to temperature VII: Facial movement with lower left facial weakness VIII: hearing is intact to voice X: Uvula elevates symmetrically XI: Shoulder shrug is symmetric. XII: tongue is midline without atrophy or fasciculations.  Motor: Tone is normal. Bulk is normal. 5/5 strength was present in all four extremities.  Sensory: Sensation is symmetric to light touch and temperature in the arms and legs. Cerebellar: FNF and HKS are intact bilaterally    I have reviewed labs in epic and the results pertinent to this consultation are: LDL 177  I have reviewed the images obtained: CT/CTA-negative MRI-small subcortical infarct  Impression: 60 year old male with acute ischemic infarct in the subcortical white matter on the right.  This appears  to be a small vessel associated infarct, low suspicion for embolic disease.  Recommendations: 1) follow-up echo 2) aspirin 81 mg and Plavix 75 mg x3 weeks followed by aspirin monotherapy 3) high intensity statin 4) I have recommended to the patient to stop using any nicotine-containing products. 5) follow-up with stroke clinic and internal medicine.   Roland Rack, MD [facility] [phone]  If 7pm- 7am, please page neurology on call as listed in [place].

## 2020-09-17 NOTE — Discharge Summary (Signed)
Physician Discharge Summary  [name] NFA:[id number] DOB: [birth year] DOA: 09/16/2020  PCP: Velna Hatchet, MD  Admit date: 09/16/2020 Discharge date: 09/17/2020  Admitted From: Observation Disposition: home  Recommendations for Outpatient Follow-up:  1. Follow up with PCP in 1-2 weeks 2. Follow-up with neurology as discussed:  Home Health:No Equipment/Devices:none  Discharge Condition:Stable CODE STATUS:Full code Diet recommendation: Cardiac diet  Brief/Interim Summary: This is a 60 year old male with a past history of hypertension not on any medications who presented to the hospital with 24-hour history of left facial droop and dysarthria.  Patient was seen in the hospital service admitted for further evaluation concerning for acute CVA.  Hospital course: Acute CVA.  CTA was negative for acute vascular abnormality.  MRI did identify an acute stroke.  Patient was placed on aspirin, statin and seen by neurology in consultation.  He will be discharged home on dual antiplatelet therapy of aspirin and Plavix for 21 days and then Plavix alone thereafter.  He will be on high intensity statin of Lipitor 80 mg daily.  We initiated treatment blood pressure medications.  Patient will follow-up closely with PCP for continued outpatient surveillance of lipid management, blood pressure management and post hospital follow-up for acute stroke.  Discussed the case with neurology after they evaluated the patient stable for discharge home today which is patient's preference.  Discharge Diagnoses:  Active Problems:   Facial droop   Dysarthria   HTN (hypertension)    Discharge Instructions  Discharge Instructions    Call MD for:   Complete by: As directed    Any acute change in medical condition to include new neuro deficits.   Diet - low sodium heart healthy   Complete by: As directed    Increase activity slowly   Complete by: As directed      Allergies as of 09/17/2020   No Known  Allergies     Medication List    TAKE these medications   amLODipine 5 MG tablet Commonly known as: NORVASC Take 1 tablet (5 mg total) by mouth daily. Start taking on: September 18, 2020   aspirin 81 MG chewable tablet Chew 1 tablet (81 mg total) by mouth daily for 21 days. For 21 days then plavix alone Start taking on: September 18, 2020   atorvastatin 40 MG tablet Commonly known as: LIPITOR Take 2 tablets (80 mg total) by mouth daily. Start taking on: September 18, 2020   ibuprofen 200 MG tablet Commonly known as: ADVIL Take 400 mg by mouth every 6 (six) hours as needed for pain.   Lumify 0.025 % Soln Generic drug: Brimonidine Tartrate Place 1 drop into both eyes daily as needed (redness).       No Known Allergies  Consultations:  Neurology   Procedures/Studies: CT Angio Head W/Cm &/Or Wo Cm  Result Date: 09/16/2020 CLINICAL DATA:  Right facial droop and slurred speech EXAM: CT ANGIOGRAPHY HEAD AND NECK TECHNIQUE: Multidetector CT imaging of the head and neck was performed using the standard protocol during bolus administration of intravenous contrast. Multiplanar CT image reconstructions and MIPs were obtained to evaluate the vascular anatomy. Carotid stenosis measurements (when applicable) are obtained utilizing NASCET criteria, using the distal internal carotid diameter as the denominator. CONTRAST:  120mL OMNIPAQUE IOHEXOL 350 MG/ML SOLN COMPARISON:  None. FINDINGS: CT HEAD FINDINGS Brain: There is no mass, hemorrhage or extra-axial collection. The size and configuration of the ventricles and extra-axial CSF spaces are normal. There is hypoattenuation of the periventricular white matter, most  commonly indicating chronic ischemic microangiopathy. Skull: The visualized skull base, calvarium and extracranial soft tissues are normal. Sinuses/Orbits: No fluid levels or advanced mucosal thickening of the visualized paranasal sinuses. No mastoid or middle ear effusion. The orbits are  normal. CTA NECK FINDINGS SKELETON: There is no bony spinal canal stenosis. No lytic or blastic lesion. OTHER NECK: Normal pharynx, larynx and major salivary glands. No cervical lymphadenopathy. Unremarkable thyroid gland. UPPER CHEST: No pneumothorax or pleural effusion. No nodules or masses. AORTIC ARCH: There is calcific atherosclerosis of the aortic arch. There is no aneurysm, dissection or hemodynamically significant stenosis of the visualized portion of the aorta. Conventional 3 vessel aortic branching pattern. The visualized proximal subclavian arteries are widely patent. RIGHT CAROTID SYSTEM: No dissection, occlusion or aneurysm. Mild atherosclerotic calcification at the carotid bifurcation without hemodynamically significant stenosis. LEFT CAROTID SYSTEM: No dissection, occlusion or aneurysm. Mild atherosclerotic calcification at the carotid bifurcation without hemodynamically significant stenosis. VERTEBRAL ARTERIES: Left dominant configuration. Both origins are clearly patent. There is no dissection, occlusion or flow-limiting stenosis to the skull base (V1-V3 segments). CTA HEAD FINDINGS POSTERIOR CIRCULATION: --Vertebral arteries: Normal V4 segments. --Inferior cerebellar arteries: Normal. --Basilar artery: Normal. --Superior cerebellar arteries: Normal. --Posterior cerebral arteries (PCA): Normal. ANTERIOR CIRCULATION: --Intracranial internal carotid arteries: Normal. --Anterior cerebral arteries (ACA): Normal. Both A1 segments are present. Patent anterior communicating artery (a-comm). --Middle cerebral arteries (MCA): Normal. VENOUS SINUSES: As permitted by contrast timing, patent. ANATOMIC VARIANTS: None Review of the MIP images confirms the above findings. IMPRESSION: 1. No emergent large vessel occlusion or hemodynamically significant stenosis of the head or neck. 2. Chronic ischemic microangiopathy. Aortic Atherosclerosis (ICD10-I70.0). Electronically Signed   By: Ulyses Jarred M.D.   On:  09/16/2020 20:25   CT Angio Neck W and/or Wo Contrast  Result Date: 09/16/2020 CLINICAL DATA:  Right facial droop and slurred speech EXAM: CT ANGIOGRAPHY HEAD AND NECK TECHNIQUE: Multidetector CT imaging of the head and neck was performed using the standard protocol during bolus administration of intravenous contrast. Multiplanar CT image reconstructions and MIPs were obtained to evaluate the vascular anatomy. Carotid stenosis measurements (when applicable) are obtained utilizing NASCET criteria, using the distal internal carotid diameter as the denominator. CONTRAST:  183mL OMNIPAQUE IOHEXOL 350 MG/ML SOLN COMPARISON:  None. FINDINGS: CT HEAD FINDINGS Brain: There is no mass, hemorrhage or extra-axial collection. The size and configuration of the ventricles and extra-axial CSF spaces are normal. There is hypoattenuation of the periventricular white matter, most commonly indicating chronic ischemic microangiopathy. Skull: The visualized skull base, calvarium and extracranial soft tissues are normal. Sinuses/Orbits: No fluid levels or advanced mucosal thickening of the visualized paranasal sinuses. No mastoid or middle ear effusion. The orbits are normal. CTA NECK FINDINGS SKELETON: There is no bony spinal canal stenosis. No lytic or blastic lesion. OTHER NECK: Normal pharynx, larynx and major salivary glands. No cervical lymphadenopathy. Unremarkable thyroid gland. UPPER CHEST: No pneumothorax or pleural effusion. No nodules or masses. AORTIC ARCH: There is calcific atherosclerosis of the aortic arch. There is no aneurysm, dissection or hemodynamically significant stenosis of the visualized portion of the aorta. Conventional 3 vessel aortic branching pattern. The visualized proximal subclavian arteries are widely patent. RIGHT CAROTID SYSTEM: No dissection, occlusion or aneurysm. Mild atherosclerotic calcification at the carotid bifurcation without hemodynamically significant stenosis. LEFT CAROTID SYSTEM: No  dissection, occlusion or aneurysm. Mild atherosclerotic calcification at the carotid bifurcation without hemodynamically significant stenosis. VERTEBRAL ARTERIES: Left dominant configuration. Both origins are clearly patent. There is no dissection, occlusion or  flow-limiting stenosis to the skull base (V1-V3 segments). CTA HEAD FINDINGS POSTERIOR CIRCULATION: --Vertebral arteries: Normal V4 segments. --Inferior cerebellar arteries: Normal. --Basilar artery: Normal. --Superior cerebellar arteries: Normal. --Posterior cerebral arteries (PCA): Normal. ANTERIOR CIRCULATION: --Intracranial internal carotid arteries: Normal. --Anterior cerebral arteries (ACA): Normal. Both A1 segments are present. Patent anterior communicating artery (a-comm). --Middle cerebral arteries (MCA): Normal. VENOUS SINUSES: As permitted by contrast timing, patent. ANATOMIC VARIANTS: None Review of the MIP images confirms the above findings. IMPRESSION: 1. No emergent large vessel occlusion or hemodynamically significant stenosis of the head or neck. 2. Chronic ischemic microangiopathy. Aortic Atherosclerosis (ICD10-I70.0). Electronically Signed   By: Ulyses Jarred M.D.   On: 09/16/2020 20:25   MR BRAIN WO CONTRAST  Result Date: 09/16/2020 CLINICAL DATA:  Left facial droop EXAM: MRI HEAD WITHOUT CONTRAST TECHNIQUE: Multiplanar, multiecho pulse sequences of the brain and surrounding structures were obtained without intravenous contrast. COMPARISON:  None. 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 No acute or chronic hemorrhage. There is multifocal hyperintense T2-weighted signal within the white matter. Generalized volume loss without a clear lobar predilection. The midline structures are normal. Vascular: Major flow voids are preserved. Skull and upper cervical spine: Normal calvarium and skull base. Visualized upper cervical spine and soft tissues are normal. Sinuses/Orbits:No paranasal sinus fluid levels or  advanced mucosal thickening. No mastoid or middle ear effusion. Normal orbits. 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 No hemorrhage or mass effect. Electronically Signed   By: Ulyses Jarred M.D.   On: 09/16/2020 23:26   ECHOCARDIOGRAM COMPLETE  Result Date: 09/17/2020    ECHOCARDIOGRAM REPORT   Patient Name:   Connor Phillips Date of Exam: 09/17/2020 Medical Rec #:  [id number]      Height:       66.0 in Accession #:    [id number]     Weight:       169.5 lb Date of Birth:  January 30, 1961       BSA:          1.864 m Patient Age:    60 years       BP:           151/103 mmHg Patient Gender: M              HR:           60 bpm. Exam Location:  Inpatient Procedure: 2D Echo, Cardiac Doppler and Color Doppler Indications:    TIA G45.9  History:        Patient has no prior history of Echocardiogram examinations.                 Risk Factors:Hypertension.  Sonographer:    Jonelle Sidle Dance Referring Phys: [employer]  1. Left ventricular ejection fraction, by estimation, is 60 to 65%. The left ventricle has normal function. The left ventricle has no regional wall motion abnormalities. Left ventricular diastolic parameters are indeterminate.  2. Right ventricular systolic function is normal. The right ventricular size is normal.  3. The mitral valve is normal in structure. No evidence of mitral valve regurgitation. No evidence of mitral stenosis.  4. The aortic valve has an indeterminant number of cusps. There is moderate calcification of the aortic valve. There is moderate thickening of the aortic valve. Aortic valve regurgitation is not visualized. No aortic stenosis is present.  5. The inferior vena cava is normal in size with greater than 50% respiratory variability, suggesting right atrial  pressure of 3 mmHg. FINDINGS  Left Ventricle: Left ventricular ejection fraction, by estimation, is 60 to 65%. The left ventricle has normal function. The left ventricle has no regional wall motion  abnormalities. The left ventricular internal cavity size was normal in size. There is  no left ventricular hypertrophy. Left ventricular diastolic parameters are indeterminate. Right Ventricle: The right ventricular size is normal. No increase in right ventricular wall thickness. Right ventricular systolic function is normal. Left Atrium: Left atrial size was normal in size. Right Atrium: Right atrial size was normal in size. Pericardium: There is no evidence of pericardial effusion. Mitral Valve: The mitral valve is normal in structure. There is mild thickening of the mitral valve leaflet(s). There is mild calcification of the mitral valve leaflet(s). Mild mitral annular calcification. No evidence of mitral valve regurgitation. No evidence of mitral valve stenosis. Tricuspid Valve: The tricuspid valve is normal in structure. Tricuspid valve regurgitation is trivial. No evidence of tricuspid stenosis. Aortic Valve: The aortic valve has an indeterminant number of cusps. There is moderate calcification of the aortic valve. There is moderate thickening of the aortic valve. There is moderate aortic valve annular calcification. Aortic valve regurgitation is not visualized. No aortic stenosis is present. Pulmonic Valve: The pulmonic valve was not well visualized. Pulmonic valve regurgitation is not visualized. No evidence of pulmonic stenosis. Aorta: The aortic root is normal in size and structure. Pulmonary Artery: Indeterminant PASP, inadequate TR jet. Venous: The inferior vena cava is normal in size with greater than 50% respiratory variability, suggesting right atrial pressure of 3 mmHg. IAS/Shunts: No atrial level shunt detected by color flow Doppler.  LEFT VENTRICLE PLAX 2D LVIDd:         4.90 cm  Diastology LVIDs:         3.30 cm  LV e' medial:    4.79 cm/s LV PW:         0.90 cm  LV E/e' medial:  13.4 LV IVS:        0.80 cm  LV e' lateral:   10.10 cm/s LVOT diam:     2.00 cm  LV E/e' lateral: 6.3 LV SV:         53  LV SV Index:   28 LVOT Area:     3.14 cm  RIGHT VENTRICLE             IVC RV Basal diam:  3.10 cm     IVC diam: 1.90 cm RV Mid diam:    2.30 cm RV S prime:     12.60 cm/s TAPSE (M-mode): 2.0 cm LEFT ATRIUM             Index       RIGHT ATRIUM           Index LA diam:        3.50 cm 1.88 cm/m  RA Area:     18.20 cm LA Vol (A2C):   42.6 ml 22.85 ml/m RA Volume:   55.00 ml  29.51 ml/m LA Vol (A4C):   26.6 ml 14.27 ml/m LA Biplane Vol: 35.2 ml 18.88 ml/m  AORTIC VALVE LVOT Vmax:   80.80 cm/s LVOT Vmean:  58.700 cm/s LVOT VTI:    0.168 m  AORTA Ao Root diam: 2.80 cm Ao Asc diam:  3.30 cm MITRAL VALVE MV Area (PHT): 3.21 cm    SHUNTS MV Decel Time: 236 msec    Systemic VTI:  0.17 m MV E velocity: 64.00 cm/s  Systemic  Diam: 2.00 cm MV A velocity: 70.00 cm/s MV E/A ratio:  0.91 Carlyle Dolly MD Electronically signed by Carlyle Dolly MD Signature Date/Time: 09/17/2020/1:27:02 PM    Final        Subjective:   Discharge Exam: Vitals:   09/17/20 1025 09/17/20 1315  BP: (!) 151/103 (!) 146/93  Pulse: 70 69  Resp: (!) 22 20  Temp: 98.2 F (36.8 C) 97.7 F (36.5 C)  SpO2: 95% 94%   Vitals:   09/17/20 0636 09/17/20 0830 09/17/20 1025 09/17/20 1315  BP: (!) 154/99 (!) 173/105 (!) 151/103 (!) 146/93  Pulse: 62 64 70 69  Resp: 16 18 (!) 22 20  Temp: 97.9 F (36.6 C) 98.5 F (36.9 C) 98.2 F (36.8 C) 97.7 F (36.5 C)  TempSrc:  Oral    SpO2: 93% 94% 95% 94%  Weight:      Height:        General: Pt is alert, awake, not in acute distress Cardiovascular: RRR, S1/S2 +, no rubs, no gallops Respiratory: CTA bilaterally, no wheezing, no rhonchi Abdominal: Soft, NT, ND, bowel sounds + Extremities: no edema, no cyanosis    The results of significant diagnostics from this hospitalization (including imaging, microbiology, ancillary and laboratory) are listed below for reference.     Microbiology: Recent Results (from the past 240 hour(s))  Resp Panel by RT-PCR (Flu A&B, Covid)  Nasopharyngeal Swab     Status: None   Collection Time: 09/16/20  7:02 PM   Specimen: Nasopharyngeal Swab; Nasopharyngeal(NP) swabs in vial transport medium  Result Value Ref Range Status   SARS Coronavirus 2 by RT PCR NEGATIVE NEGATIVE Final    Comment: (NOTE) SARS-CoV-2 target nucleic acids are NOT DETECTED.  The SARS-CoV-2 RNA is generally detectable in upper respiratory specimens during the acute phase of infection. The lowest concentration of SARS-CoV-2 viral copies this assay can detect is 138 copies/mL. A negative result does not preclude SARS-Cov-2 infection and should not be used as the sole basis for treatment or other patient management decisions. A negative result may occur with  improper specimen collection/handling, submission of specimen other than nasopharyngeal swab, presence of viral mutation(s) within the areas targeted by this assay, and inadequate number of viral copies(<138 copies/mL). A negative result must be combined with clinical observations, patient history, and epidemiological information. The expected result is Negative.  Fact Sheet for Patients:  EntrepreneurPulse.com.au  Fact Sheet for Healthcare Providers:  IncredibleEmployment.be  This test is no t yet approved or cleared by the [place] FDA and  has been authorized for detection and/or diagnosis of SARS-CoV-2 by FDA under an Emergency Use Authorization (EUA). This EUA will remain  in effect (meaning this test can be used) for the duration of the COVID-19 declaration under Section 564(b)(1) of the Act, 21 U.S.C.section 360bbb-3(b)(1), unless the authorization is terminated  or revoked sooner.       Influenza A by PCR NEGATIVE NEGATIVE Final   Influenza B by PCR NEGATIVE NEGATIVE Final    Comment: (NOTE) The Xpert Xpress SARS-CoV-2/FLU/RSV plus assay is intended as an aid in the diagnosis of influenza from Nasopharyngeal swab specimens and should not be  used as a sole basis for treatment. Nasal washings and aspirates are unacceptable for Xpert Xpress SARS-CoV-2/FLU/RSV testing.  Fact Sheet for Patients: EntrepreneurPulse.com.au  Fact Sheet for Healthcare Providers: IncredibleEmployment.be  This test is not yet approved or cleared by the [place] FDA and has been authorized for detection and/or diagnosis of SARS-CoV-2 by FDA under  an Emergency Use Authorization (EUA). This EUA will remain in effect (meaning this test can be used) for the duration of the COVID-19 declaration under Section 564(b)(1) of the Act, 21 U.S.C. section 360bbb-3(b)(1), unless the authorization is terminated or revoked.  Performed at [facility], [place] [address] [place], [place] [address]      Labs: BNP (last 3 results) No results for input(s): BNP in the last 8760 hours. Basic Metabolic Panel: Recent Labs  Lab 09/16/20 1939 09/16/20 1947  NA 143 141  K 3.8 3.8  CL 107 107  CO2  --  24  GLUCOSE 89 89  BUN 19 17  CREATININE 1.00 0.97  CALCIUM  --  9.0   Liver Function Tests: Recent Labs  Lab 09/16/20 1947  AST 26  ALT 23  ALKPHOS 76  BILITOT 0.6  PROT 7.8  ALBUMIN 4.1   No results for input(s): LIPASE, AMYLASE in the last 168 hours. No results for input(s): AMMONIA in the last 168 hours. CBC: Recent Labs  Lab 09/16/20 1939 09/16/20 1947  WBC  --  7.2  NEUTROABS  --  4.4  HGB 16.7 17.0  HCT 49.0 49.5  MCV  --  92.7  PLT  --  229   Cardiac Enzymes: No results for input(s): CKTOTAL, CKMB, CKMBINDEX, TROPONINI in the last 168 hours. BNP: Invalid input(s): POCBNP CBG: No results for input(s): GLUCAP in the last 168 hours. D-Dimer No results for input(s): DDIMER in the last 72 hours. Hgb A1c No results for input(s): HGBA1C in the last 72 hours. Lipid Profile Recent Labs    09/17/20 0443  CHOL 251*  HDL 42  LDLCALC 177*  TRIG 160*  CHOLHDL 6.0   Thyroid  function studies No results for input(s): TSH, T4TOTAL, T3FREE, THYROIDAB in the last 72 hours.  Invalid input(s): FREET3 Anemia work up No results for input(s): VITAMINB12, FOLATE, FERRITIN, TIBC, IRON, RETICCTPCT in the last 72 hours. Urinalysis    Component Value Date/Time   COLORURINE STRAW (A) 09/16/2020 2045   APPEARANCEUR HAZY (A) 09/16/2020 2045   LABSPEC 1.019 09/16/2020 2045   PHURINE 6.0 09/16/2020 2045   GLUCOSEU NEGATIVE 09/16/2020 2045   HGBUR MODERATE (A) 09/16/2020 2045   BILIRUBINUR NEGATIVE 09/16/2020 2045   KETONESUR NEGATIVE 09/16/2020 2045   PROTEINUR NEGATIVE 09/16/2020 2045   NITRITE NEGATIVE 09/16/2020 2045   LEUKOCYTESUR LARGE (A) 09/16/2020 2045   Sepsis Labs Invalid input(s): PROCALCITONIN,  WBC,  LACTICIDVEN Microbiology Recent Results (from the past 240 hour(s))  Resp Panel by RT-PCR (Flu A&B, Covid) Nasopharyngeal Swab     Status: None   Collection Time: 09/16/20  7:02 PM   Specimen: Nasopharyngeal Swab; Nasopharyngeal(NP) swabs in vial transport medium  Result Value Ref Range Status   SARS Coronavirus 2 by RT PCR NEGATIVE NEGATIVE Final    Comment: (NOTE) SARS-CoV-2 target nucleic acids are NOT DETECTED.  The SARS-CoV-2 RNA is generally detectable in upper respiratory specimens during the acute phase of infection. The lowest concentration of SARS-CoV-2 viral copies this assay can detect is 138 copies/mL. A negative result does not preclude SARS-Cov-2 infection and should not be used as the sole basis for treatment or other patient management decisions. A negative result may occur with  improper specimen collection/handling, submission of specimen other than nasopharyngeal swab, presence of viral mutation(s) within the areas targeted by this assay, and inadequate number of viral copies(<138 copies/mL). A negative result must be combined with clinical observations, patient history, and epidemiological information. The  expected result is  Negative.  Fact Sheet for Patients:  EntrepreneurPulse.com.au  Fact Sheet for Healthcare Providers:  IncredibleEmployment.be  This test is no t yet approved or cleared by the [place] FDA and  has been authorized for detection and/or diagnosis of SARS-CoV-2 by FDA under an Emergency Use Authorization (EUA). This EUA will remain  in effect (meaning this test can be used) for the duration of the COVID-19 declaration under Section 564(b)(1) of the Act, 21 U.S.C.section 360bbb-3(b)(1), unless the authorization is terminated  or revoked sooner.       Influenza A by PCR NEGATIVE NEGATIVE Final   Influenza B by PCR NEGATIVE NEGATIVE Final    Comment: (NOTE) The Xpert Xpress SARS-CoV-2/FLU/RSV plus assay is intended as an aid in the diagnosis of influenza from Nasopharyngeal swab specimens and should not be used as a sole basis for treatment. Nasal washings and aspirates are unacceptable for Xpert Xpress SARS-CoV-2/FLU/RSV testing.  Fact Sheet for Patients: EntrepreneurPulse.com.au  Fact Sheet for Healthcare Providers: IncredibleEmployment.be  This test is not yet approved or cleared by the [place] FDA and has been authorized for detection and/or diagnosis of SARS-CoV-2 by FDA under an Emergency Use Authorization (EUA). This EUA will remain in effect (meaning this test can be used) for the duration of the COVID-19 declaration under Section 564(b)(1) of the Act, 21 U.S.C. section 360bbb-3(b)(1), unless the authorization is terminated or revoked.  Performed at [facility], [place] [address] [place], [place] [address]      Time coordinating discharge: Over 30 minutes  SIGNED:   Nicolette Bang, MD  [facility] 09/17/2020, 2:40 PM Pager   If 7PM-7AM, please contact night-coverage [email or web address] Password [email or web address]

## 2020-09-17 NOTE — Progress Notes (Signed)
OT Cancellation Note  Patient Details Name: Connor Phillips MRN: [id number] DOB: 12/15/1960   Cancelled Treatment:    Reason Eval/Treat Not Completed: OT screened, no needs identified, will sign off  Malka So 09/17/2020, 10:17 AM  Nestor Lewandowsky, OTR/L Acute Rehabilitation Services Pager: [phone] Office: [phone]

## 2020-09-19 LAB — HEMOGLOBIN A1C
Hgb A1c MFr Bld: 5.8 % — ABNORMAL HIGH (ref 4.8–5.6)
Mean Plasma Glucose: 120 mg/dL

## 2020-10-18 ENCOUNTER — Telehealth (HOSPITAL_COMMUNITY): Payer: Self-pay | Admitting: Physician Assistant

## 2020-10-18 MED ORDER — AMLODIPINE BESYLATE 5 MG PO TABS: 5.0000 mg | ORAL_TABLET | Freq: Every day | ORAL | 0 refills | Status: AC

## 2020-10-18 NOTE — Telephone Encounter (Signed)
Nursing staff reports to me with request to refill this patient's medication as he had a PCP appointment for refill, which was canceled and pushed back to June.  5 mg amlodipine daily sent.

## 2020-12-01 ENCOUNTER — Ambulatory Visit (INDEPENDENT_AMBULATORY_CARE_PROVIDER_SITE_OTHER): Payer: 59 | Admitting: Neurology

## 2020-12-01 ENCOUNTER — Encounter: Payer: Self-pay | Admitting: Neurology

## 2020-12-01 VITALS — BP 144/87 | HR 75 | Ht 66.0 in | Wt 166.8 lb

## 2020-12-01 DIAGNOSIS — I639 Cerebral infarction, unspecified: Secondary | ICD-10-CM | POA: Diagnosis not present

## 2020-12-01 DIAGNOSIS — I6381 Other cerebral infarction due to occlusion or stenosis of small artery: Secondary | ICD-10-CM | POA: Diagnosis not present

## 2020-12-01 DIAGNOSIS — R29810 Facial weakness: Secondary | ICD-10-CM | POA: Diagnosis not present

## 2020-12-01 NOTE — Progress Notes (Signed)
[facility] [address] [place]. [place] [address] [phone]       OFFICE CONSULT NOTE  [name] Date of Birth:  Apr 29, 1961 Medical Record Number:  [id number]   Referring MD: Kathrynn Speed  Reason for Referral: Stroke  HPI: [name] is a 60 year old Caucasian male seen today for initial office consultation visit for stroke.  History is obtained from the patient and review of electronic medical records and I have personally reviewed pertinent imaging films in PACS.  He has no significant past medical history except diet-controlled hypertension.  He presented on 09/17/2020 for evaluation with sudden onset of slurred speech and facial droop.  He states in fact symptoms began the day before but he did not seek help he went to work and came back for the next day symptoms got worse and the wife noticed facial weakness prompting visit to the hospital.  His NIH stroke scale on admission was 2 but he presented be on time window for tPA.  CT scan of the head was unremarkable CT angiogram showed no significant large vessel stenosis or occlusion.  MRI showed a small right corona radiata acute infarct.  LDL cholesterol elevated 177 mg percent and hemoglobin A1c was 5.8.  Urine drug screen was negative.  Transthoracic echo showed ejection fraction of 60 to 65% without cardiac source of embolism.  Patient was started on dual antiplatelet therapy aspirin 81 and Plavix 75 mg daily for 3 weeks which she finished and subsequently has stopped aspirin and is currently on Plavix alone which is tolerating well without bruising or bleeding.  He states his slurred speech and facial droop improved within a few days.  He has no residual deficits.  He is tolerating Lipitor 80 mg daily without any muscle aches or pains.  He did have follow-up lipid profile checked by his primary care physician and LDL cholesterol had come down to 89 mg percent.  I do not have the actual report to review.   Patient states he has been eating healthy and exercising and has in fact lost 9 pounds and plans to lose more.  He does do vaping but does not smoke cigarettes.  He has no prior history of TIAs or strokes and has had no recurrent symptoms.  There is no family history of strokes.  He has no other new complaints today.  ROS:   14 system review of systems is positive for slurred speech, facial droop, dizziness, lightheadedness all other systems negative  PMH:  Past Medical History:  Diagnosis Date   Hypertension    controlled by diet and exercise   Stroke [facility])     Social History:  Social History   Socioeconomic History   Marital status: Married    Spouse name: Connor Phillips   Number of children: Not on file   Years of education: Not on file   Highest education level: Not on file  Occupational History   Not on file  Tobacco Use   Smoking status: Former    Pack years: 0.00    Types: Cigarettes    Quit date: 10/03/2010    Years since quitting: 10.1   Smokeless tobacco: Never  Vaping Use   Vaping Use: Every day  Substance and Sexual Activity   Alcohol use: Yes    Alcohol/week: 1.0 - 2.0 standard drink    Types: 1 - 2 Cans of beer per week   Drug use: No   Sexual activity: Not on file  Other Topics  Concern   Not on file  Social History Narrative   Lives with wife Connor Phillips and son, at home   Right Handed   Drinks 2-3 cups of caffeine daily   Social Determinants of Health   Financial Resource Strain: Not on file  Food Insecurity: Not on file  Transportation Needs: Not on file  Physical Activity: Not on file  Stress: Not on file  Social Connections: Not on file  Intimate Partner Violence: Not on file    Medications:   Current Outpatient Medications on File Prior to Visit  Medication Sig Dispense Refill   Brimonidine Tartrate (LUMIFY) 0.025 % SOLN Place 1 drop into both eyes daily as needed (redness).     clopidogrel (PLAVIX) 75 MG tablet Take 75 mg by mouth daily.      ibuprofen (ADVIL,MOTRIN) 200 MG tablet Take 400 mg by mouth every 6 (six) hours as needed for pain.     amLODipine (NORVASC) 5 MG tablet Take 1 tablet (5 mg total) by mouth daily. 30 tablet 0   atorvastatin (LIPITOR) 40 MG tablet Take 2 tablets (80 mg total) by mouth daily. 60 tablet 0   No current facility-administered medications on file prior to visit.    Allergies:  No Known Allergies  Physical Exam General: well developed, well nourished, seated, in no evident distress Head: head normocephalic and atraumatic.   Neck: supple with no carotid or supraclavicular bruits Cardiovascular: regular rate and rhythm, no murmurs Musculoskeletal: no deformity Skin:  no rash/petichiae Vascular:  Normal pulses all extremities  Neurologic Exam Mental Status: Awake and fully alert. Oriented to place and time. Recent and remote memory intact. Attention span, concentration and fund of knowledge appropriate. Mood and affect appropriate.  Cranial Nerves: Fundoscopic exam reveals sharp disc margins. Pupils equal, briskly reactive to light. Extraocular movements full without nystagmus. Visual fields full to confrontation. Hearing intact. Facial sensation intact.  Trace, tongue, palate moves normally and symmetrically. 1Sensory.: intact to touch , pinprick , position and vibratory sensation.  Coordination: Rapid alternating movements normal in all extremities. Finger-to-nose and heel-to-shin performed accurately bilaterally. Gait and Station: Arises from chair without difficulty. Stance is normal. Gait demonstrates normal stride length and balance . Able to heel, toe and tandem walk without difficulty.  Reflexes: 1+ and symmetric. Toes downgoing.   NIHSS 1 Modified Rankin 0   ASSESSMENT: 60 year old Caucasian male with right subcortical lacunar infarct in April 2022 from small vessel disease.  Vascular risk factors of hypertension, hyperlipidemia ,tobacco use and mild obesity.     PLAN: I had a long  d/w patient about his recent lacunar stroke, risk for recurrent stroke/TIAs, personally independently reviewed imaging studies and stroke evaluation results and answered questions.Continue clopidogrel 75 mg daily  for secondary stroke prevention and maintain strict control of hypertension with blood pressure goal below 130/90, diabetes with hemoglobin A1c goal below 6.5% and lipids with LDL cholesterol goal below 70 mg/dL. I advised him to quit vaping as well.I also advised the patient to eat a healthy diet with plenty of whole grains, cereals, fruits and vegetables, exercise regularly and maintain ideal body weight Followup in the future with my nurse practitioner Janett Billow in 3 months or call earlier if needed.  Greater than 50% time during this 45-minute consultation visit was spent on counseling and coordination of care about his lacunar stroke discussion about stroke prevention and treatment and answering questions. Antony Contras, MD Note: This document was prepared with digital dictation and possible smart phrase technology. Any transcriptional  errors that result from this process are unintentional.

## 2020-12-01 NOTE — Patient Instructions (Signed)
I had a long d/w patient about his recent lacunar stroke, risk for recurrent stroke/TIAs, personally independently reviewed imaging studies and stroke evaluation results and answered questions.Continue clopidogrel 75 mg daily  for secondary stroke prevention and maintain strict control of hypertension with blood pressure goal below 130/90, diabetes with hemoglobin A1c goal below 6.5% and lipids with LDL cholesterol goal below 70 mg/dL. I advised him to quit vaping as well.I also advised the patient to eat a healthy diet with plenty of whole grains, cereals, fruits and vegetables, exercise regularly and maintain ideal body weight Followup in the future with my nurse practitioner Janett Billow in 3 months or call earlier if needed. Stroke Prevention Some medical conditions and behaviors are associated with a higher chance of having a stroke. You can help prevent a stroke by making nutrition, lifestyle,and other changes, including managing any medical conditions you may have. What nutrition changes can be made?  Eat healthy foods. You can do this by: Choosing foods high in fiber, such as fresh fruits and vegetables and whole grains. Eating at least 5 or more servings of fruits and vegetables a day. Try to fill half of your plate at each meal with fruits and vegetables. Choosing lean protein foods, such as lean cuts of meat, poultry without skin, fish, tofu, beans, and nuts. Eating low-fat dairy products. Avoiding foods that are high in salt (sodium). This can help lower blood pressure. Avoiding foods that have saturated fat, trans fat, and cholesterol. This can help prevent high cholesterol. Avoiding processed and premade foods. Follow your health care provider's specific guidelines for losing weight, controlling high blood pressure (hypertension), lowering high cholesterol, and managing diabetes. These may include: Reducing your daily calorie intake. Limiting your daily sodium intake to 1,500 milligrams  (mg). Using only healthy fats for cooking, such as olive oil, canola oil, or sunflower oil. Counting your daily carbohydrate intake. What lifestyle changes can be made? Maintain a healthy weight. Talk to your health care provider about your ideal weight. Get at least 30 minutes of moderate physical activity at least 5 days a week. Moderate activity includes brisk walking, biking, and swimming. Do not use any products that contain nicotine or tobacco, such as cigarettes and e-cigarettes. If you need help quitting, ask your health care provider. It may also be helpful to avoid exposure to secondhand smoke. Limit alcohol intake to no more than 1 drink a day for nonpregnant women and 2 drinks a day for men. One drink equals 12 oz of beer, 5 oz of wine, or 1 oz of hard liquor. Stop any illegal drug use. Avoid taking birth control pills. Talk to your health care provider about the risks of taking birth control pills if: You are over 68 years old. You smoke. You get migraines. You have ever had a blood clot. What other changes can be made? Manage your cholesterol levels. Eating a healthy diet is important for preventing high cholesterol. If cholesterol cannot be managed through diet alone, you may also need to take medicines. Take any prescribed medicines to control your cholesterol as told by your health care provider. Manage your diabetes. Eating a healthy diet and exercising regularly are important parts of managing your blood sugar. If your blood sugar cannot be managed through diet and exercise, you may need to take medicines. Take any prescribed medicines to control your diabetes as told by your health care provider. Control your hypertension. To reduce your risk of stroke, try to keep your blood pressure below 130/80.  Eating a healthy diet and exercising regularly are an important part of controlling your blood pressure. If your blood pressure cannot be managed through diet and exercise, you  may need to take medicines. Take any prescribed medicines to control hypertension as told by your health care provider. Ask your health care provider if you should monitor your blood pressure at home. Have your blood pressure checked every year, even if your blood pressure is normal. Blood pressure increases with age and some medical conditions. Get evaluated for sleep disorders (sleep apnea). Talk to your health care provider about getting a sleep evaluation if you snore a lot or have excessive sleepiness. Take over-the-counter and prescription medicines only as told by your health care provider. Aspirin or blood thinners (antiplatelets or anticoagulants) may be recommended to reduce your risk of forming blood clots that can lead to stroke. Make sure that any other medical conditions you have, such as atrial fibrillation or atherosclerosis, are managed. What are the warning signs of a stroke? The warning signs of a stroke can be easily remembered as BEFAST. B is for balance. Signs include: Dizziness. Loss of balance or coordination. Sudden trouble walking. E is for eyes. Signs include: A sudden change in vision. Trouble seeing. F is for face. Signs include: Sudden weakness or numbness of the face. The face or eyelid drooping to one side. A is for arms. Signs include: Sudden weakness or numbness of the arm, usually on one side of the body. S is for speech. Signs include: Trouble speaking (aphasia). Trouble understanding. T is for time. These symptoms may represent a serious problem that is an emergency. Do not wait to see if the symptoms will go away. Get medical help right away. Call your local emergency services (911 in the [place] Do not drive yourself to the hospital. Other signs of stroke may include: A sudden, severe headache with no known cause. Nausea or vomiting. Seizure. Where to find more information For more information, visit: American Stroke Association:  www.strokeassociation.org National Stroke Association: www.stroke.org Summary You can prevent a stroke by eating healthy, exercising, not smoking, limiting alcohol intake, and managing any medical conditions you may have. Do not use any products that contain nicotine or tobacco, such as cigarettes and e-cigarettes. If you need help quitting, ask your health care provider. It may also be helpful to avoid exposure to secondhand smoke. Remember BEFAST for warning signs of stroke. Get help right away if you or a loved one has any of these signs. This information is not intended to replace advice given to you by your health care provider. Make sure you discuss any questions you have with your healthcare provider. Document Revised: 05/03/2017 Document Reviewed: 06/26/2016 Elsevier Patient Education  2021 [employer].

## 2021-03-07 NOTE — Progress Notes (Signed)
[facility] [address] [place]. [place] [address] [phone]       OFFICE FOLLOW UP NOTE  [name] Date of Birth:  [birth year] Medical Record Number:  [id number]   Primary neurologist: Dr. Leonie Phillips Referring MD: Connor Phillips  Reason for Referral: Stroke  Chief Complaint  Patient presents with   Follow-up    Rm 3 alone Pt is  well and stable, no new concerns.       HPI:   Connor Phillips is a 60 y.o. 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   Update 03/08/2021 Connor Phillips: Returns for 21-month stroke follow-up.  Overall stable.  Denies new or reoccurring stroke/TIA symptoms.  Continues to work and maintains all ADLs and IADLs independently.  Compliant on Plavix and atorvastatin without side effects.  Blood pressure today 129/90.  Does not routinely monitor at home is typically stable.  No new concerns at this time     History provided for reference purposes only Consult visit 12/01/2020 Dr. Leonie Phillips: Connor Phillips is a 60 year old Caucasian male seen today for initial office consultation visit for stroke.  History is obtained from the patient and review of electronic medical records and I have personally reviewed pertinent imaging films in PACS.  He has no significant past medical history except diet-controlled hypertension.  He presented on 09/17/2020 for evaluation with sudden onset of slurred speech and facial droop.  He states in fact symptoms began the day before but he did not seek help he went to work and came back for the next day symptoms got worse and the wife noticed facial weakness prompting visit to the hospital.  His NIH stroke scale on admission was 2 but he presented be on time window for tPA.  CT scan of the head was unremarkable CT angiogram showed no significant large vessel stenosis or occlusion.  MRI showed a small right corona radiata acute infarct.   LDL cholesterol elevated 177 mg percent and hemoglobin A1c was 5.8.  Urine drug screen was negative.  Transthoracic echo showed ejection fraction of 60 to 65% without cardiac source of embolism.  Patient was started on dual antiplatelet therapy aspirin 81 and Plavix 75 mg daily for 3 weeks which she finished and subsequently has stopped aspirin and is currently on Plavix alone which is tolerating well without bruising or bleeding.  He states his slurred speech and facial droop improved within a few days.  He has no residual deficits.  He is tolerating Lipitor 80 mg daily without any muscle aches or pains.  He did have follow-up lipid profile checked by his primary care physician and LDL cholesterol had come down to 89 mg percent.  I do not have the actual report to review.  Patient states he has been eating healthy and exercising and has in fact lost 9 pounds and plans to lose more.  He does do vaping but does not smoke cigarettes.  He has no prior history of TIAs or strokes and has had no recurrent symptoms.  There is no family history of strokes.  He has no other new complaints today.  ROS:   14 system review of systems is positive for those listed in HPI and all other systems negative  PMH:  Past Medical History:  Diagnosis Date   Hypertension    controlled by diet and exercise   Stroke [facility])     Social History:  Social History   Socioeconomic History   Marital status: Married    Spouse name: Connor Phillips   Number of children: Not on file   Years of education: Not on file   Highest education level: Not on file  Occupational History   Not on file  Tobacco Use   Smoking status: Former    Types: Cigarettes    Quit date: 10/03/2010    Years since quitting: 10.4   Smokeless tobacco: Never  Vaping Use   Vaping Use: Every day  Substance and Sexual Activity   Alcohol use: Yes    Alcohol/week: 1.0 - 2.0 standard drink    Types: 1 - 2 Cans of beer per week   Drug use: No   Sexual activity: Not  on file  Other Topics Concern   Not on file  Social History Narrative   Lives with wife Connor Phillips and son, at home   Right Handed   Drinks 2-3 cups of caffeine daily   Social Determinants of Health   Financial Resource Strain: Not on file  Food Insecurity: Not on file  Transportation Needs: Not on file  Physical Activity: Not on file  Stress: Not on file  Social Connections: Not on file  Intimate Partner Violence: Not on file    Medications:   Current Outpatient Medications on File Prior to Visit  Medication Sig Dispense Refill   amLODipine (NORVASC) 5 MG tablet Take 1 tablet (5 mg total) by mouth daily. 30 tablet 0   atorvastatin (LIPITOR) 40 MG tablet Take 2 tablets (80 mg total) by mouth daily. 60 tablet 0   Brimonidine Tartrate (LUMIFY) 0.025 % SOLN Place 1 drop into both eyes daily as needed (redness).     clopidogrel (PLAVIX) 75 MG tablet Take 75 mg by mouth daily.     ibuprofen (ADVIL,MOTRIN) 200 MG tablet Take 400 mg by mouth every 6 (six) hours as needed for pain.     No current facility-administered medications on file prior to visit.    Allergies:  No Known Allergies  Physical Exam Today's Vitals   03/08/21 0732  BP: 129/90  Pulse: 71  Weight: 166 lb (75.3 kg)  Height: 5\' 6"  (1.676 m)   Body mass index is 26.79 kg/m.   General: well developed, well nourished, very pleasant middle-age Caucasian male, seated, in no evident distress Head: head normocephalic and atraumatic.   Neck: supple with no carotid or supraclavicular bruits Cardiovascular: regular rate and rhythm, no murmurs Musculoskeletal: no deformity Skin:  no rash/petichiae Vascular:  Normal pulses all extremities  Neurologic Exam Mental Status: Awake and fully alert.  Fluent speech and language.  Oriented to place and time. Recent and remote memory intact. Attention span, concentration and fund of knowledge appropriate. Mood and affect appropriate.  Cranial Nerves: Pupils equal, briskly reactive  to light. Extraocular movements full without nystagmus. Visual fields full to confrontation. Hearing intact. Facial sensation intact.  Trace, tongue, palate moves normally and symmetrically.  Sensory.: intact to touch , pinprick , position and vibratory sensation.  Coordination: Rapid alternating movements normal in all extremities. Finger-to-nose and heel-to-shin performed accurately bilaterally. Gait and Station: Arises from chair without difficulty. Stance is normal. Gait demonstrates normal stride length and balance . Able to heel, toe and tandem walk without difficulty.  Reflexes: 1+ and symmetric. Toes downgoing.       ASSESSMENT: 60 year old Caucasian male with right subcortical lacunar infarct in April 2022 from small vessel disease without residual deficit.  Vascular risk factors of  hypertension, hyperlipidemia ,tobacco use and mild obesity.    PLAN: -Continue clopidogrel 75 mg daily and atorvastatin 80 mg daily for secondary stroke prevention -Close follow-up with PCP for aggressive stroke risk factor management -HTN: BP goal<130/90.  Stable on current regimen managed by PCP -HLD: LDL goal<70.  Recent LDL 88 (12/2020) on atorvastatin 80 mg daily -Tobacco use: Discussed importance of complete tobacco cessation   Follow-up in 6 months or call earlier if needed     CC:  Merrilee Seashore, MD   I spent 26 minutes of face-to-face and non-face-to-face time with patient.  This included previsit chart review, lab review, study review, order entry, electronic health record documentation, patient education and discussion regarding history of prior stroke, secondary stroke prevention measures and aggressive stroke risk factor management and answered all other questions to patient's satisfaction  Frann Rider, [facility]  [facility] [address] [place], [place] [address]  Phone [phone] Fax [phone] Note: This document was prepared with  digital dictation and possible smart phrase technology. Any transcriptional errors that result from this process are unintentional.

## 2021-03-08 ENCOUNTER — Ambulatory Visit (INDEPENDENT_AMBULATORY_CARE_PROVIDER_SITE_OTHER): Payer: 59 | Admitting: Adult Health

## 2021-03-08 ENCOUNTER — Encounter: Payer: Self-pay | Admitting: Adult Health

## 2021-03-08 VITALS — BP 129/90 | HR 71 | Ht 66.0 in | Wt 166.0 lb

## 2021-03-08 DIAGNOSIS — E785 Hyperlipidemia, unspecified: Secondary | ICD-10-CM | POA: Diagnosis not present

## 2021-03-08 DIAGNOSIS — I10 Essential (primary) hypertension: Secondary | ICD-10-CM

## 2021-03-08 DIAGNOSIS — I639 Cerebral infarction, unspecified: Secondary | ICD-10-CM | POA: Diagnosis not present

## 2021-03-08 NOTE — Patient Instructions (Signed)
Continue clopidogrel 75 mg daily  and atorvastatin for secondary stroke prevention  Continue to follow up with PCP regarding cholesterol and blood pressure management  Maintain strict control of hypertension with blood pressure goal below 130/90 and cholesterol with LDL cholesterol (bad cholesterol) goal below 70 mg/dL.       Followup in the future with me in 6 months or call earlier if needed       Thank you for coming to see [place] at [facility]. I hope we have been able to provide you high quality care today.  You may receive a patient satisfaction survey over the next few weeks. We would appreciate your feedback and comments so that we may continue to improve ourselves and the health of our patients.

## 2021-09-06 ENCOUNTER — Encounter: Payer: Self-pay | Admitting: Adult Health

## 2021-09-06 ENCOUNTER — Ambulatory Visit (INDEPENDENT_AMBULATORY_CARE_PROVIDER_SITE_OTHER): Payer: No Typology Code available for payment source | Admitting: Adult Health

## 2021-09-06 VITALS — BP 137/93 | HR 74 | Ht 67.0 in | Wt 170.0 lb

## 2021-09-06 DIAGNOSIS — I639 Cerebral infarction, unspecified: Secondary | ICD-10-CM

## 2021-09-06 NOTE — Patient Instructions (Signed)
Continue clopidogrel 75 mg daily  and atorvastatin for secondary stroke prevention ? ?Continue to follow up with PCP regarding cholesterol and blood pressure management  ?Maintain strict control of hypertension with blood pressure goal below 130/90 and cholesterol with LDL cholesterol (bad cholesterol) goal below 70 mg/dL.  ? ?Signs of a Stroke? Follow the BEFAST method:  ?Balance Watch for a sudden loss of balance, trouble with coordination or vertigo ?Eyes Is there a sudden loss of vision in one or both eyes? Or double vision?  ?Face: Ask the person to smile. Does one side of the face droop or is it numb?  ?Arms: Ask the person to raise both arms. Does one arm drift downward? Is there weakness or numbness of a leg? ?Speech: Ask the person to repeat a simple phrase. Does the speech sound slurred/strange? Is the person confused ? ?Time: If you observe any of these signs, call 911. ? ? ? ? ? ? ?Thank you for coming to see [place] at [facility]. I hope we have been able to provide you high quality care today. ? ?You may receive a patient satisfaction survey over the next few weeks. We would appreciate your feedback and comments so that we may continue to improve ourselves and the health of our patients. ? ?

## 2021-09-06 NOTE — Progress Notes (Signed)
?[facility] ?[address] ?[place]. [place] [address] ?[phone] ? ?     OFFICE FOLLOW UP NOTE ? ?[name] ?Date of Birth:  Oct 15, 1960 ?Medical Record Number:  [id number]  ? ?Primary neurologist: Dr. Leonie Man ?Referring MD: Kathrynn Speed ? ?Reason for Referral: Stroke ? ?Chief Complaint  ?Patient presents with  ? Follow-up  ?  Rm 3 alone ?Pt is well and stable, no new concerns   ?  ? ? ?HPI:  ? ?Connor Phillips is a 61 y.o. 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 ? ?Update 09/06/2021 JM: Patient returns for 64-monthstroke follow-up.  Overall stable without new or reoccurring stroke/TIA symptoms.  Maintains ADLs and IADLs independently, continues to work and drive.  Remains on Plavix and atorvastatin, denies side effects.  Blood pressure today 137/93.  No concerns at this time. ? ? ? ?History provided for reference purposes only ?Update 03/08/2021 JM: Returns for 320-monthtroke follow-up.  Overall stable.  Denies new or reoccurring stroke/TIA symptoms.  Continues to work and maintains all ADLs and IADLs independently.  Compliant on Plavix and atorvastatin without side effects.  Blood pressure today 129/90.  Does not routinely monitor at home is typically stable.  No new concerns at this time ? ?Consult visit 12/01/2020 Dr. SeLeonie ManMr. MaSeifers a [age]ear old Caucasian male seen today for initial office consultation visit for stroke.  History is obtained from the patient and review of electronic medical records and I have personally reviewed pertinent imaging films in PACS.  He has no significant past medical history except diet-controlled hypertension.  He presented on 09/17/2020 for evaluation with sudden onset of slurred speech and facial droop.  He states in fact symptoms began the day before but he did not seek help he went to work and came back for the next day symptoms got worse and the  wife noticed facial weakness prompting visit to the hospital.  His NIH stroke scale on admission was 2 but he presented be on time window for tPA.  CT scan of the head was unremarkable CT angiogram showed no significant large vessel stenosis or occlusion.  MRI showed a small right corona radiata acute infarct.  LDL cholesterol elevated 177 mg percent and hemoglobin A1c was 5.8.  Urine drug screen was negative.  Transthoracic echo showed ejection fraction of 60 to 65% without cardiac source of embolism.  Patient was started on dual antiplatelet therapy aspirin 81 and Plavix 75 mg daily for 3 weeks which she finished and subsequently has stopped aspirin and is currently on Plavix alone which is tolerating well without bruising or bleeding.  He states his slurred speech and facial droop improved within a few days.  He has no residual deficits.  He is tolerating Lipitor 80 mg daily without any muscle aches or pains.  He did have follow-up lipid profile checked by his primary care physician and LDL cholesterol had come down to 89 mg percent.  I do not have the actual report to review.  Patient states he has been eating healthy and exercising and has in fact lost 9 pounds and plans to lose more.  He does do vaping but does not smoke cigarettes.  He has no prior history of TIAs or strokes and has had no recurrent symptoms.  There is no family history of strokes.  He has no other new complaints today. ? ?ROS:   ?  14 system review of systems is positive for those listed in HPI and all other systems negative ? ?PMH:  ?Past Medical History:  ?Diagnosis Date  ? Hypertension   ? controlled by diet and exercise  ? Stroke [facility])   ? ? ?Social History:  ?Social History  ? ?Socioeconomic History  ? Marital status: Married  ?  Spouse name: [name]  ? Number of children: Not on file  ? Years of education: Not on file  ? Highest education level: Not on file  ?Occupational History  ? Not on file  ?Tobacco Use  ? Smoking status: Former  ?   Types: Cigarettes  ?  Quit date: 10/03/2010  ?  Years since quitting: 10.9  ? Smokeless tobacco: Never  ?Vaping Use  ? Vaping Use: Every day  ?Substance and Sexual Activity  ? Alcohol use: Yes  ?  Alcohol/week: 1.0 - 2.0 standard drink  ?  Types: 1 - 2 Cans of beer per week  ? Drug use: No  ? Sexual activity: Not on file  ?Other Topics Concern  ? Not on file  ?Social History Narrative  ? Lives with wife [name] and son, at home  ? Right Handed  ? Drinks 2-3 cups of caffeine daily  ? ?Social Determinants of Health  ? ?Financial Resource Strain: Not on file  ?Food Insecurity: Not on file  ?Transportation Needs: Not on file  ?Physical Activity: Not on file  ?Stress: Not on file  ?Social Connections: Not on file  ?Intimate Partner Violence: Not on file  ? ? ?Medications:   ?Current Outpatient Medications on File Prior to Visit  ?Medication Sig Dispense Refill  ? amLODipine (NORVASC) 5 MG tablet Take 1 tablet (5 mg total) by mouth daily. 30 tablet 0  ? atorvastatin (LIPITOR) 40 MG tablet Take 2 tablets (80 mg total) by mouth daily. 60 tablet 0  ? Brimonidine Tartrate (LUMIFY) 0.025 % SOLN Place 1 drop into both eyes daily as needed (redness).    ? clopidogrel (PLAVIX) 75 MG tablet Take 75 mg by mouth daily.    ? ibuprofen (ADVIL,MOTRIN) 200 MG tablet Take 400 mg by mouth every 6 (six) hours as needed for pain.    ? ?No current facility-administered medications on file prior to visit.  ? ? ?Allergies:  No Known Allergies ? ?Physical Exam ?Today's Vitals  ? 09/06/21 0735  ?BP: (!) 137/93  ?Pulse: 74  ?Weight: 170 lb (77.1 kg)  ?Height: '5\' 7"'$  (1.702 m)  ? ? ?Body mass index is 26.63 kg/m?.  ? ?General: well developed, well nourished, very pleasant middle-age Caucasian male, seated, in no evident distress ?Head: head normocephalic and atraumatic.   ?Neck: supple with no carotid or supraclavicular bruits ?Cardiovascular: regular rate and rhythm, no murmurs ?Musculoskeletal: no deformity ?Skin:  no rash/petichiae ?Vascular:   Normal pulses all extremities ? ?Neurologic Exam ?Mental Status: Awake and fully alert.  Fluent speech and language.  Oriented to place and time. Recent and remote memory intact. Attention span, concentration and fund of knowledge appropriate. Mood and affect appropriate.  ?Cranial Nerves: Pupils equal, briskly reactive to light. Extraocular movements full without nystagmus. Visual fields full to confrontation. Hearing intact. Facial sensation intact.  Trace, tongue, palate moves normally and symmetrically.  ?Sensory.: intact to touch , pinprick , position and vibratory sensation.  ?Coordination: Rapid alternating movements normal in all extremities. Finger-to-nose and heel-to-shin performed accurately bilaterally. ?Gait and Station: Arises from chair without difficulty. Stance is normal. Gait demonstrates normal stride  length and balance . Able to heel, toe and tandem walk without difficulty.  ?Reflexes: 1+ and symmetric. Toes downgoing.  ? ? ? ? ? 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  Vascular risk factors of hypertension, hyperlipidemia ,tobacco use and mild obesity. ? ? ? ?PLAN: ?-Continue clopidogrel 75 mg daily and atorvastatin 80 mg daily for secondary stroke prevention ?-Close follow-up with PCP for aggressive stroke risk factor management including BP goal<130/90, HLD with LDL goal<70 and DM with A1c.<7 ?-Stroke labs 08/2021: A1c 5.7, LDL 83 ?-Tobacco use: Discussed importance of complete tobacco cessation ? ? ?Doing well from stroke standpoint and risk factors are managed by PCP. She may follow up PRN, as usual for our patients who are strictly being followed for stroke. If any new neurological issues should arise, request PCP place referral for evaluation by one of our neurologists. Thank you.  ? ? ? ?CC:  ?Merrilee Seashore, MD  ? ?I spent 22 minutes of face-to-face and non-face-to-face time with  patient.  This included previsit chart review, lab review, study review, electronic health record documentation, patient education and discussion regarding history of prior stroke, secondary stroke preve

## 2022-07-09 IMAGING — CT CT ANGIO HEAD
2 of 7 series · 9 of 33 positions shown · IV contrast (omnipaque)
Comparison: None.

CLINICAL DATA: Right facial droop and slurred speech

EXAM:
CT ANGIOGRAPHY HEAD AND NECK
TECHNIQUE: Multidetector CT imaging of the head and neck was performed using
the standard protocol during bolus administration of intravenous
contrast. Multiplanar CT image reconstructions and MIPs were
obtained to evaluate the vascular anatomy. Carotid stenosis
measurements (when applicable) are obtained utilizing NASCET
criteria, using the distal internal carotid diameter as the
denominator.
CONTRAST:  100mL OMNIPAQUE IOHEXOL 350 MG/ML SOLN

[Series 9: cta head neck · axial · 0.52mm/px · z∈[-16,+110]mm · 2 of 189 slices shown]
[im 63/189  soft-tissue]
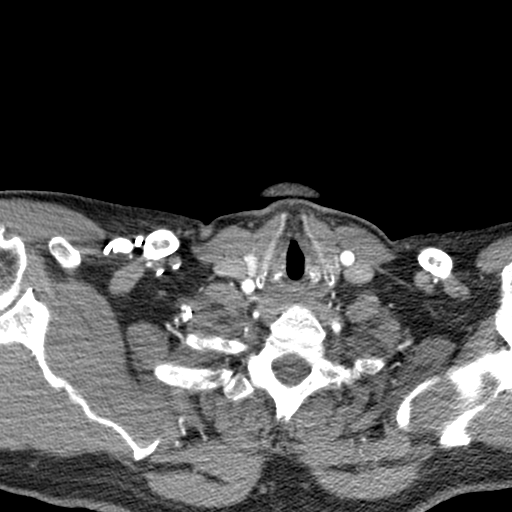
[im 126/189  soft-tissue]
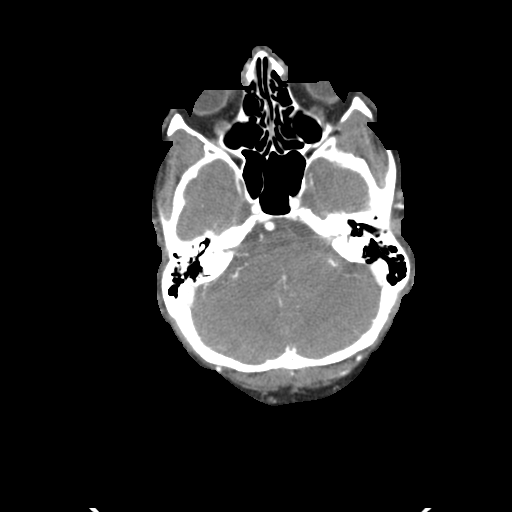

[Series 11: ax thin · axial · 0.42mm/px · z∈[-93,+189]mm · 7 of 376 slices shown]
[im 47/376  soft-tissue]
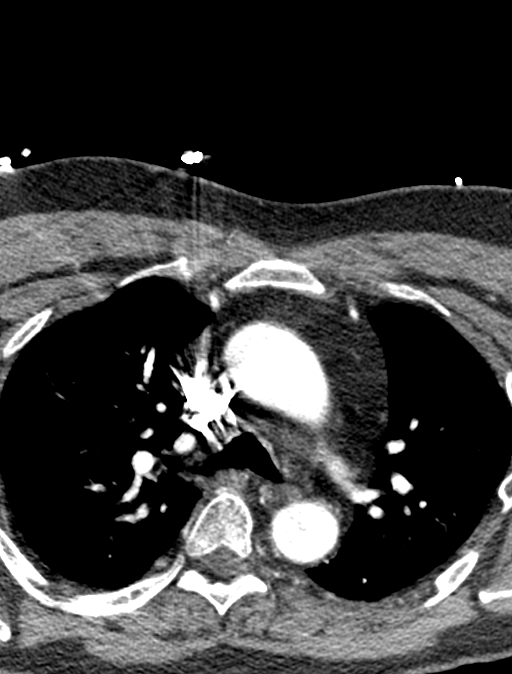
[im 94/376  bone]
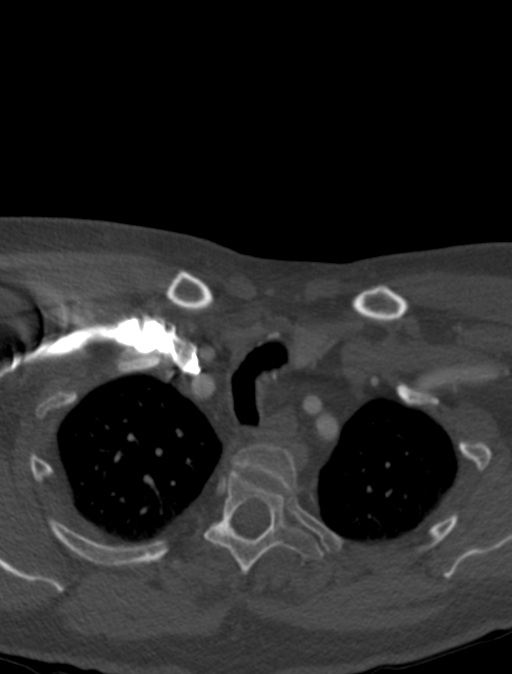
[im 141/376  soft-tissue]
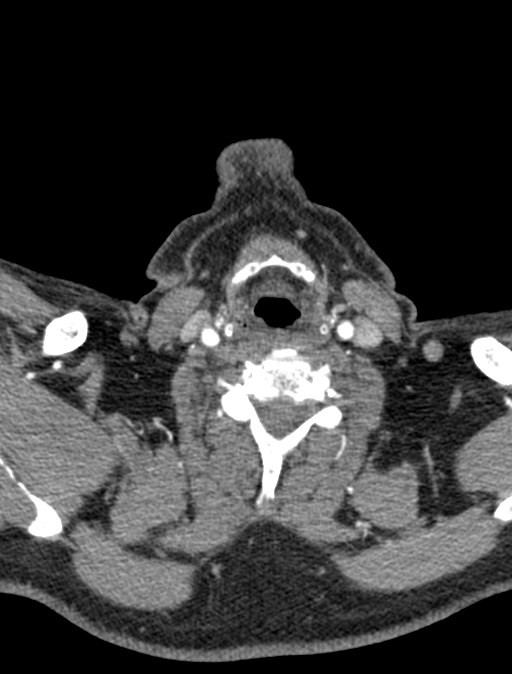
[im 188/376  bone]
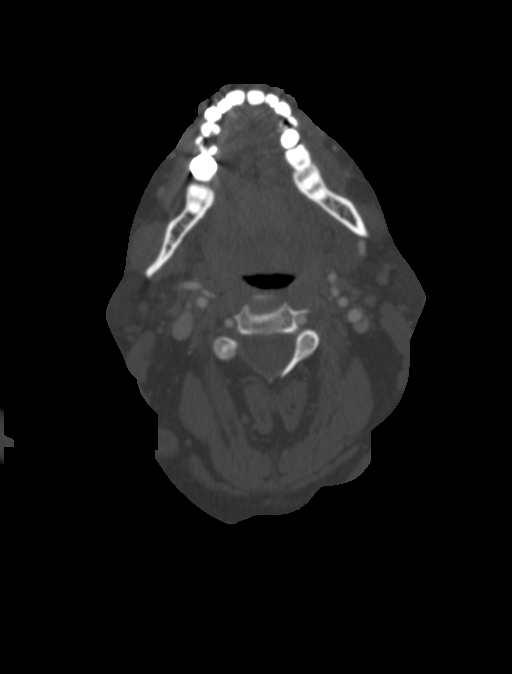
[im 235/376  soft-tissue]
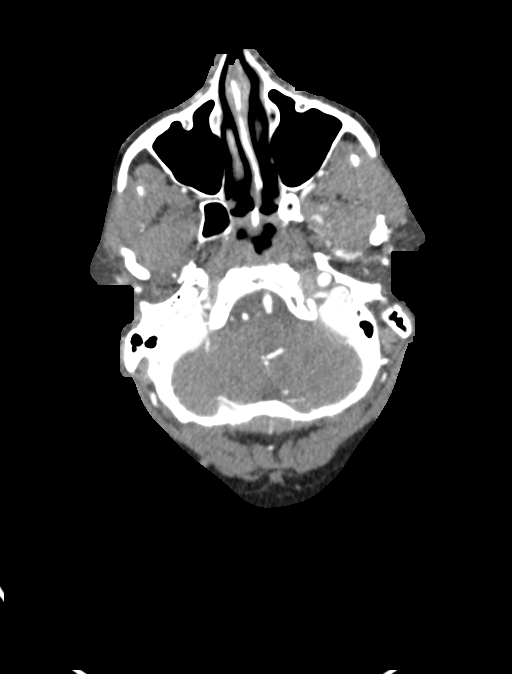
[im 282/376  bone]
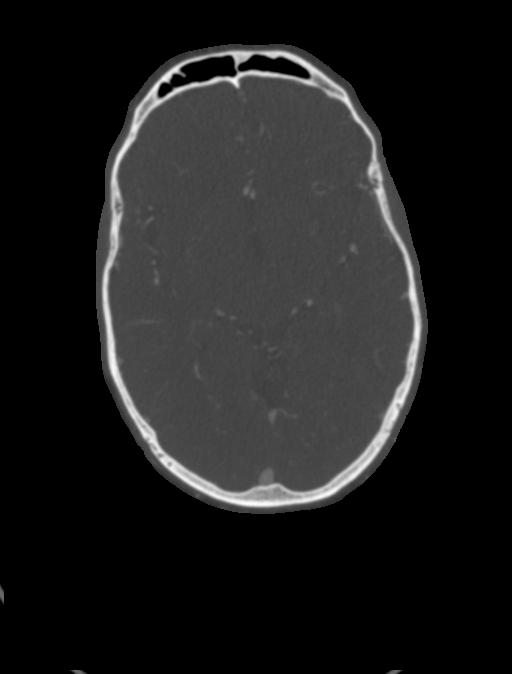
[im 329/376  soft-tissue]
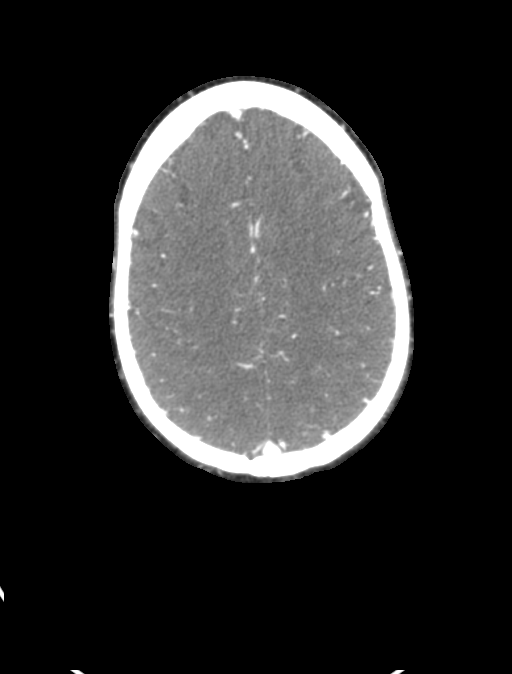

[9 of 33 positions shown; findings below may reference images not displayed]

FINDINGS: CT HEAD FINDINGS

Brain: There is no mass, hemorrhage or extra-axial collection. The
size and configuration of the ventricles and extra-axial CSF spaces
are normal. There is hypoattenuation of the periventricular white
matter, most commonly indicating chronic ischemic microangiopathy.

Skull: The visualized skull base, calvarium and extracranial soft
tissues are normal.

Sinuses/Orbits: No fluid levels or advanced mucosal thickening of
the visualized paranasal sinuses. No mastoid or middle ear effusion.
The orbits are normal.

CTA NECK FINDINGS

SKELETON: There is no bony spinal canal stenosis. No lytic or
blastic lesion.

OTHER NECK: Normal pharynx, larynx and major salivary glands. No
cervical lymphadenopathy. Unremarkable thyroid gland.

UPPER CHEST: No pneumothorax or pleural effusion. No nodules or
masses.

AORTIC ARCH:

There is calcific atherosclerosis of the aortic arch. There is no
aneurysm, dissection or hemodynamically significant stenosis of the
visualized portion of the aorta. Conventional 3 vessel aortic
branching pattern. The visualized proximal subclavian arteries are
widely patent.

RIGHT CAROTID SYSTEM: No dissection, occlusion or aneurysm. Mild
atherosclerotic calcification at the carotid bifurcation without
hemodynamically significant stenosis.

LEFT CAROTID SYSTEM: No dissection, occlusion or aneurysm. Mild
atherosclerotic calcification at the carotid bifurcation without
hemodynamically significant stenosis.

VERTEBRAL ARTERIES: Left dominant configuration. Both origins are
clearly patent. There is no dissection, occlusion or flow-limiting
stenosis to the skull base (V1-V3 segments).

CTA HEAD FINDINGS

POSTERIOR CIRCULATION:

--Vertebral arteries: Normal V4 segments.

--Inferior cerebellar arteries: Normal.

--Basilar artery: Normal.

--Superior cerebellar arteries: Normal.

--Posterior cerebral arteries (PCA): Normal.

ANTERIOR CIRCULATION:

--Intracranial internal carotid arteries: Normal.

--Anterior cerebral arteries (ACA): Normal. Both A1 segments are
present. Patent anterior communicating artery (a-comm).

--Middle cerebral arteries (MCA): Normal.

VENOUS SINUSES: As permitted by contrast timing, patent.

ANATOMIC VARIANTS: None

Review of the MIP images confirms the above findings.
IMPRESSION: 1. No emergent large vessel occlusion or hemodynamically significant
stenosis of the head or neck.
2. Chronic ischemic microangiopathy.

Aortic Atherosclerosis ([id number]-UX0.0).

## 2023-09-19 DIAGNOSIS — R7303 Prediabetes: Secondary | ICD-10-CM | POA: Diagnosis not present

## 2023-09-19 DIAGNOSIS — I10 Essential (primary) hypertension: Secondary | ICD-10-CM | POA: Diagnosis not present

## 2023-09-19 DIAGNOSIS — E782 Mixed hyperlipidemia: Secondary | ICD-10-CM | POA: Diagnosis not present

## 2023-09-19 DIAGNOSIS — I69322 Dysarthria following cerebral infarction: Secondary | ICD-10-CM | POA: Diagnosis not present

## 2023-09-27 DIAGNOSIS — E782 Mixed hyperlipidemia: Secondary | ICD-10-CM | POA: Diagnosis not present

## 2023-09-27 DIAGNOSIS — E7841 Elevated Lipoprotein(a): Secondary | ICD-10-CM | POA: Diagnosis not present

## 2023-09-27 DIAGNOSIS — I10 Essential (primary) hypertension: Secondary | ICD-10-CM | POA: Diagnosis not present

## 2023-09-27 DIAGNOSIS — I69322 Dysarthria following cerebral infarction: Secondary | ICD-10-CM | POA: Diagnosis not present

## 2024-03-20 DIAGNOSIS — I10 Essential (primary) hypertension: Secondary | ICD-10-CM | POA: Diagnosis not present

## 2024-03-20 DIAGNOSIS — E782 Mixed hyperlipidemia: Secondary | ICD-10-CM | POA: Diagnosis not present

## 2024-03-20 DIAGNOSIS — I69322 Dysarthria following cerebral infarction: Secondary | ICD-10-CM | POA: Diagnosis not present

## 2024-03-20 DIAGNOSIS — Z0000 Encounter for general adult medical examination without abnormal findings: Secondary | ICD-10-CM | POA: Diagnosis not present

## 2024-03-20 DIAGNOSIS — E7841 Elevated Lipoprotein(a): Secondary | ICD-10-CM | POA: Diagnosis not present
